# Patient Record
Sex: Female | Born: 1990 | Race: Black or African American | Hispanic: No | Marital: Single | Smoking: Current every day smoker
Health system: Southern US, Community
[De-identification: ages and names within clinical notes are randomized; demographics above are authoritative.]

## PROBLEM LIST (undated history)

## (undated) ENCOUNTER — Inpatient Hospital Stay (HOSPITAL_COMMUNITY): Payer: Self-pay

## (undated) DIAGNOSIS — A549 Gonococcal infection, unspecified: Secondary | ICD-10-CM

## (undated) DIAGNOSIS — A599 Trichomoniasis, unspecified: Secondary | ICD-10-CM

## (undated) DIAGNOSIS — B999 Unspecified infectious disease: Secondary | ICD-10-CM

## (undated) DIAGNOSIS — A749 Chlamydial infection, unspecified: Secondary | ICD-10-CM

## (undated) DIAGNOSIS — A64 Unspecified sexually transmitted disease: Secondary | ICD-10-CM

## (undated) HISTORY — PX: INDUCED ABORTION: SHX677

## (undated) HISTORY — PX: NO PAST SURGERIES: SHX2092

---

## 2013-12-15 ENCOUNTER — Emergency Department (HOSPITAL_BASED_OUTPATIENT_CLINIC_OR_DEPARTMENT_OTHER): Payer: Self-pay

## 2013-12-15 ENCOUNTER — Emergency Department (HOSPITAL_BASED_OUTPATIENT_CLINIC_OR_DEPARTMENT_OTHER)
Admission: EM | Admit: 2013-12-15 | Discharge: 2013-12-15 | Disposition: A | Discharge status: Home / Self Care | Payer: Self-pay | Attending: Emergency Medicine | Admitting: Emergency Medicine

## 2013-12-15 ENCOUNTER — Encounter (HOSPITAL_BASED_OUTPATIENT_CLINIC_OR_DEPARTMENT_OTHER): Payer: Self-pay | Admitting: Emergency Medicine

## 2013-12-15 DIAGNOSIS — Y9389 Activity, other specified: Secondary | ICD-10-CM | POA: Insufficient documentation

## 2013-12-15 DIAGNOSIS — S99921A Unspecified injury of right foot, initial encounter: Secondary | ICD-10-CM

## 2013-12-15 DIAGNOSIS — F17200 Nicotine dependence, unspecified, uncomplicated: Secondary | ICD-10-CM | POA: Insufficient documentation

## 2013-12-15 DIAGNOSIS — IMO0002 Reserved for concepts with insufficient information to code with codable children: Secondary | ICD-10-CM | POA: Insufficient documentation

## 2013-12-15 DIAGNOSIS — S99919A Unspecified injury of unspecified ankle, initial encounter: Principal | ICD-10-CM

## 2013-12-15 DIAGNOSIS — S99929A Unspecified injury of unspecified foot, initial encounter: Principal | ICD-10-CM

## 2013-12-15 DIAGNOSIS — S8990XA Unspecified injury of unspecified lower leg, initial encounter: Secondary | ICD-10-CM | POA: Insufficient documentation

## 2013-12-15 DIAGNOSIS — Y92410 Unspecified street and highway as the place of occurrence of the external cause: Secondary | ICD-10-CM | POA: Insufficient documentation

## 2013-12-15 MED ORDER — IBUPROFEN 600 MG PO TABS: 600.0000 mg | ORAL_TABLET | Freq: Four times a day (QID) | ORAL | Status: DC | PRN

## 2013-12-15 MED ORDER — IBUPROFEN 800 MG PO TABS
800.0000 mg | ORAL_TABLET | Freq: Once | ORAL | Status: AC
  Administered 2013-12-15: 800 mg via ORAL
  Filled 2013-12-15: qty 1

## 2013-12-15 NOTE — Discharge Instructions (Signed)
Take motrin 600 mg every 6 hrs for pain.   Use crutches. Keep foot elevated.   Use ace wrap as needed.   Follow up with ortho in a week if you still have pain to get repeat xray.   Return to ER if you have severe pain, unable to walk.

## 2013-12-15 NOTE — ED Notes (Signed)
MD at bedside. 
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
## 2015-04-24 ENCOUNTER — Emergency Department (HOSPITAL_BASED_OUTPATIENT_CLINIC_OR_DEPARTMENT_OTHER)
Admission: EM | Admit: 2015-04-24 | Discharge: 2015-04-25 | Disposition: A | Discharge status: Home / Self Care | Payer: Medicaid Other | Attending: Emergency Medicine | Admitting: Emergency Medicine

## 2015-04-24 ENCOUNTER — Encounter (HOSPITAL_BASED_OUTPATIENT_CLINIC_OR_DEPARTMENT_OTHER): Payer: Self-pay | Admitting: Emergency Medicine

## 2015-04-24 DIAGNOSIS — Z202 Contact with and (suspected) exposure to infections with a predominantly sexual mode of transmission: Secondary | ICD-10-CM | POA: Diagnosis not present

## 2015-04-24 DIAGNOSIS — R109 Unspecified abdominal pain: Secondary | ICD-10-CM

## 2015-04-24 DIAGNOSIS — A5901 Trichomonal vulvovaginitis: Secondary | ICD-10-CM | POA: Diagnosis not present

## 2015-04-24 DIAGNOSIS — O23511 Infections of cervix in pregnancy, first trimester: Secondary | ICD-10-CM | POA: Insufficient documentation

## 2015-04-24 DIAGNOSIS — O9989 Other specified diseases and conditions complicating pregnancy, childbirth and the puerperium: Secondary | ICD-10-CM | POA: Diagnosis present

## 2015-04-24 DIAGNOSIS — Z3A08 8 weeks gestation of pregnancy: Secondary | ICD-10-CM | POA: Diagnosis not present

## 2015-04-24 DIAGNOSIS — F17210 Nicotine dependence, cigarettes, uncomplicated: Secondary | ICD-10-CM | POA: Insufficient documentation

## 2015-04-24 DIAGNOSIS — O99331 Smoking (tobacco) complicating pregnancy, first trimester: Secondary | ICD-10-CM | POA: Diagnosis not present

## 2015-04-24 DIAGNOSIS — A64 Unspecified sexually transmitted disease: Secondary | ICD-10-CM

## 2015-04-24 DIAGNOSIS — N760 Acute vaginitis: Secondary | ICD-10-CM

## 2015-04-24 DIAGNOSIS — B9689 Other specified bacterial agents as the cause of diseases classified elsewhere: Secondary | ICD-10-CM

## 2015-04-24 DIAGNOSIS — O26899 Other specified pregnancy related conditions, unspecified trimester: Secondary | ICD-10-CM

## 2015-04-24 NOTE — ED Notes (Signed)
Pt in c/o abd pain x 1 week, approx [redacted] weeks pregnant, first pregnancy. Denies vaginal bleeding but endorsing discharge. Requesting STD check.

## 2015-04-24 NOTE — ED Notes (Signed)
Pt ambulated with no assistance to restroom.

## 2015-04-25 LAB — COMPREHENSIVE METABOLIC PANEL
ALBUMIN: 4.2 g/dL (ref 3.5–5.0)
ALK PHOS: 53 U/L (ref 38–126)
ALT: 19 U/L (ref 14–54)
ANION GAP: 8 (ref 5–15)
AST: 24 U/L (ref 15–41)
BILIRUBIN TOTAL: 0.5 mg/dL (ref 0.3–1.2)
BUN: 8 mg/dL (ref 6–20)
CO2: 22 mmol/L (ref 22–32)
CREATININE: 0.51 mg/dL (ref 0.44–1.00)
Calcium: 8.9 mg/dL (ref 8.9–10.3)
Chloride: 106 mmol/L (ref 101–111)
GFR calc Af Amer: >60 mL/min (ref >60)
GFR calc non Af Amer: >60 mL/min (ref >60)
GLUCOSE: 97 mg/dL (ref 65–99)
Potassium: 3.6 mmol/L (ref 3.5–5.1)
SODIUM: 136 mmol/L (ref 135–145)
Total Protein: 6.6 g/dL (ref 6.5–8.1)

## 2015-04-25 LAB — CBC WITH DIFFERENTIAL/PLATELET
BASOS ABS: 0 10*3/uL (ref 0.0–0.1)
Basophils Relative: 0 %
Eosinophils Absolute: 0.1 10*3/uL (ref 0.0–0.7)
Eosinophils Relative: 2 %
HEMATOCRIT: 35.9 % — AB (ref 36.0–46.0)
HEMOGLOBIN: 12.1 g/dL (ref 12.0–15.0)
Lymphocytes Relative: 26 %
Lymphs Abs: 2.3 10*3/uL (ref 0.7–4.0)
MCH: 27.8 pg (ref 26.0–34.0)
MCHC: 33.7 g/dL (ref 30.0–36.0)
MCV: 82.3 fL (ref 78.0–100.0)
MONOS PCT: 9 %
Monocytes Absolute: 0.8 10*3/uL (ref 0.1–1.0)
NEUTROS ABS: 5.5 10*3/uL (ref 1.7–7.7)
Neutrophils Relative %: 63 %
Platelets: 217 10*3/uL (ref 150–400)
RBC: 4.36 MIL/uL (ref 3.87–5.11)
RDW: 13.3 % (ref 11.5–15.5)
WBC: 8.7 10*3/uL (ref 4.0–10.5)

## 2015-04-25 LAB — URINALYSIS, ROUTINE W REFLEX MICROSCOPIC
Bilirubin Urine: NEGATIVE
Glucose, UA: NEGATIVE mg/dL
HGB URINE DIPSTICK: NEGATIVE
Ketones, ur: NEGATIVE mg/dL
Nitrite: NEGATIVE
PH: 7 (ref 5.0–8.0)
PROTEIN: NEGATIVE mg/dL
Specific Gravity, Urine: 1.017 (ref 1.005–1.030)

## 2015-04-25 LAB — WET PREP, GENITAL
Sperm: NONE SEEN
Yeast Wet Prep HPF POC: NONE SEEN

## 2015-04-25 LAB — PREGNANCY, URINE: Preg Test, Ur: POSITIVE — AB

## 2015-04-25 LAB — URINE MICROSCOPIC-ADD ON

## 2015-04-25 LAB — LIPASE, BLOOD: Lipase: 19 U/L (ref 11–51)

## 2015-04-25 MED ORDER — AZITHROMYCIN 250 MG PO TABS: 250.0000 mg | ORAL_TABLET | Freq: Every day | ORAL | Status: DC

## 2015-04-25 MED ORDER — CEFTRIAXONE SODIUM 250 MG IJ SOLR
250.0000 mg | Freq: Once | INTRAMUSCULAR | Status: AC
  Administered 2015-04-25: 250 mg via INTRAMUSCULAR
  Filled 2015-04-25: qty 250

## 2015-04-25 MED ORDER — METRONIDAZOLE 500 MG PO TABS: 500.0000 mg | ORAL_TABLET | Freq: Two times a day (BID) | ORAL | Status: DC

## 2015-04-25 MED ORDER — ONDANSETRON 4 MG PO TBDP
4.0000 mg | ORAL_TABLET | Freq: Once | ORAL | Status: AC
  Administered 2015-04-25: 4 mg via ORAL
  Filled 2015-04-25: qty 1

## 2015-04-25 MED ORDER — AZITHROMYCIN 250 MG PO TABS
500.0000 mg | ORAL_TABLET | Freq: Once | ORAL | Status: AC
  Administered 2015-04-25: 500 mg via ORAL
  Filled 2015-04-25: qty 2

## 2015-04-25 MED ORDER — METRONIDAZOLE 500 MG PO TABS
2000.0000 mg | ORAL_TABLET | Freq: Once | ORAL | Status: AC
  Administered 2015-04-25: 2000 mg via ORAL
  Filled 2015-04-25: qty 4

## 2015-04-25 NOTE — Discharge Instructions (Signed)
Bacterial Vaginosis Sharon Pearson, you were treated for infections.  Continue to take antibiotics for 1 week.  See OB/GYN within 3 days for close follow up.  If symptoms worsen, come back to the ED immediately.  Thank you.  Bacterial vaginosis is an infection of the vagina. It happens when too many germs (bacteria) grow in the vagina. Having this infection puts you at risk for getting other infections from sex. Treating this infection can help lower your risk for other infections, such as:   Chlamydia.  Gonorrhea.  HIV.  Herpes. HOME CARE  Take your medicine as told by your doctor.  Finish your medicine even if you start to feel better.  Tell your sex partner that you have an infection. They should see their doctor for treatment.  During treatment:  Avoid sex or use condoms correctly.  Do not douche.  Do not drink alcohol unless your doctor tells you it is ok.  Do not breastfeed unless your doctor tells you it is ok. GET HELP IF:  You are not getting better after 3 days of treatment.  You have more grey fluid (discharge) coming from your vagina than before.  You have more pain than before.  You have a fever. MAKE SURE YOU:   Understand these instructions.  Will watch your condition.  Will get help right away if you are not doing well or get worse.   This information is not intended to replace advice given to you by your health care provider. Make sure you discuss any questions you have with your health care provider.   Document Released: 01/10/2008 Document Revised: 04/23/2014 Document Reviewed: 11/12/2012 Elsevier Interactive Patient Education 2016 [employer]. Trichomoniasis Trichomoniasis is an infection caused by an organism called Trichomonas. The infection can affect both women and men. In women, the outer female genitalia and the vagina are affected. In men, the penis is mainly affected, but the prostate and other reproductive organs can also be involved.  Trichomoniasis is a sexually transmitted infection (STI) and is most often passed to another person through sexual contact.  RISK FACTORS  Having unprotected sexual intercourse.  Having sexual intercourse with an infected partner. SIGNS AND SYMPTOMS  Symptoms of trichomoniasis in women include:  Abnormal gray-green frothy vaginal discharge.  Itching and irritation of the vagina.  Itching and irritation of the area outside the vagina. Symptoms of trichomoniasis in men include:   Penile discharge with or without pain.  Pain during urination. This results from inflammation of the urethra. DIAGNOSIS  Trichomoniasis may be found during a Pap test or physical exam. Your health care provider may use one of the following methods to help diagnose this infection:  Testing the pH of the vagina with a test tape.  Using a vaginal swab test that checks for the Trichomonas organism. A test is available that provides results within a few minutes.  Examining a urine sample.  Testing vaginal secretions. Your health care provider may test you for other STIs, including HIV. TREATMENT   You may be given medicine to fight the infection. Women should inform their health care provider if they could be or are pregnant. Some medicines used to treat the infection should not be taken during pregnancy.  Your health care provider may recommend over-the-counter medicines or creams to decrease itching or irritation.  Your sexual partner will need to be treated if infected.  Your health care provider may test you for infection again 3 months after treatment. HOME CARE INSTRUCTIONS  Take medicines only as directed by your health care provider.  Take over-the-counter medicine for itching or irritation as directed by your health care provider.  Do not have sexual intercourse while you have the infection.  Women should not douche or wear tampons while they have the infection.  Discuss your infection  with your partner. Your partner may have gotten the infection from you, or you may have gotten it from your partner.  Have your sex partner get examined and treated if necessary.  Practice safe, informed, and protected sex.  See your health care provider for other STI testing. SEEK MEDICAL CARE IF:   You still have symptoms after you finish your medicine.  You develop abdominal pain.  You have pain when you urinate.  You have bleeding after sexual intercourse.  You develop a rash.  Your medicine makes you sick or makes you throw up (vomit). MAKE SURE YOU:  Understand these instructions.  Will watch your condition.  Will get help right away if you are not doing well or get worse.   This information is not intended to replace advice given to you by your health care provider. Make sure you discuss any questions you have with your health care provider.   Document Released: 09/26/2000 Document Revised: 04/23/2014 Document Reviewed: 01/12/2013 Elsevier Interactive Patient Education [employer]. Sexually Transmitted Disease A sexually transmitted disease (STD) is a disease or infection often passed to another person during sex. However, STDs can be passed through nonsexual ways. An STD can be passed through:  Spit (saliva).  Semen.  Blood.  Mucus from the vagina.  Pee (urine). HOW CAN I LESSEN MY CHANCES OF GETTING AN STD?  Use:  Latex condoms.  Water-soluble lubricants with condoms. Do not use petroleum jelly or oils.  Dental dams. These are small pieces of latex that are used as a barrier during oral sex.  Avoid having more than one sex partner.  Do not have sex with someone who has other sex partners.  Do not have sex with anyone you do not know or who is at high risk for an STD.  Avoid risky sex that can break your skin.  Do not have sex if you have open sores on your mouth or skin.  Avoid drinking too much alcohol or taking illegal drugs. Alcohol  and drugs can affect your good judgment.  Avoid oral and anal sex acts.  Get shots (vaccines) for HPV and hepatitis.  If you are at risk of being infected with HIV, it is advised that you take a certain medicine daily to prevent HIV infection. This is called pre-exposure prophylaxis (PrEP). You may be at risk if:  You are a man who has sex with other men (MSM).  You are attracted to the opposite sex (heterosexual) and are having sex with more than one partner.  You take drugs with a needle.  You have sex with someone who has HIV.  Talk with your doctor about if you are at high risk of being infected with HIV. If you begin to take PrEP, get tested for HIV first. Get tested every 3 months for as long as you are taking PrEP.  Get tested for STDs every year if you are sexually active. If you are treated for an STD, get tested again 3 months after you are treated. WHAT SHOULD I DO IF I THINK I HAVE AN STD?  See your doctor.  Tell your sex partner(s) that you have an STD. They  should be tested and treated.  Do not have sex until your doctor says it is okay. WHEN SHOULD I GET HELP? Get help right away if:  You have bad belly (abdominal) pain.  You are a man and have puffiness (swelling) or pain in your testicles.  You are a woman and have puffiness in your vagina.   This information is not intended to replace advice given to you by your health care provider. Make sure you discuss any questions you have with your health care provider.   Document Released: 05/10/2004 Document Revised: 04/23/2014 Document Reviewed: 09/26/2012 Elsevier Interactive Patient Education [employer].
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
## 2015-04-25 NOTE — ED Notes (Signed)
Actively vomiting

## 2015-04-25 NOTE — ED Notes (Signed)
Pt seen by EDP prior to RN assessment, see MD notes, orders received to medicate and d/c, initiated.

## 2015-04-25 NOTE — ED Notes (Signed)
Pt vomited PO medications, EDP aware.

## 2015-04-25 NOTE — ED Notes (Signed)
meds re-given prior to pt leaving, denies needs or questions

## 2015-04-25 NOTE — ED Notes (Signed)
Denies needs or questions, meds explained, f/u explained, no obvious sx of reaction, denies new sx, alert, NAD, calm, steady gait, given Rx x2 and referral.

## 2015-04-26 LAB — GC/CHLAMYDIA PROBE AMP (~~LOC~~) NOT AT ARMC
Chlamydia: NEGATIVE
Neisseria Gonorrhea: NEGATIVE

## 2015-05-28 ENCOUNTER — Encounter (HOSPITAL_BASED_OUTPATIENT_CLINIC_OR_DEPARTMENT_OTHER): Payer: Self-pay | Admitting: Emergency Medicine

## 2015-05-28 ENCOUNTER — Emergency Department (HOSPITAL_BASED_OUTPATIENT_CLINIC_OR_DEPARTMENT_OTHER)
Admission: EM | Admit: 2015-05-28 | Discharge: 2015-05-28 | Disposition: A | Discharge status: Home / Self Care | Payer: Medicaid Other | Attending: Emergency Medicine | Admitting: Emergency Medicine

## 2015-05-28 DIAGNOSIS — N730 Acute parametritis and pelvic cellulitis: Secondary | ICD-10-CM | POA: Insufficient documentation

## 2015-05-28 DIAGNOSIS — N760 Acute vaginitis: Secondary | ICD-10-CM | POA: Diagnosis not present

## 2015-05-28 DIAGNOSIS — F17210 Nicotine dependence, cigarettes, uncomplicated: Secondary | ICD-10-CM | POA: Insufficient documentation

## 2015-05-28 DIAGNOSIS — Z3202 Encounter for pregnancy test, result negative: Secondary | ICD-10-CM | POA: Insufficient documentation

## 2015-05-28 DIAGNOSIS — B9689 Other specified bacterial agents as the cause of diseases classified elsewhere: Secondary | ICD-10-CM

## 2015-05-28 DIAGNOSIS — Z792 Long term (current) use of antibiotics: Secondary | ICD-10-CM | POA: Insufficient documentation

## 2015-05-28 DIAGNOSIS — R300 Dysuria: Secondary | ICD-10-CM | POA: Diagnosis present

## 2015-05-28 LAB — WET PREP, GENITAL
Sperm: NONE SEEN
Trich, Wet Prep: NONE SEEN

## 2015-05-28 LAB — URINALYSIS, ROUTINE W REFLEX MICROSCOPIC
BILIRUBIN URINE: NEGATIVE
Glucose, UA: NEGATIVE mg/dL
HGB URINE DIPSTICK: NEGATIVE
KETONES UR: NEGATIVE mg/dL
Leukocytes, UA: NEGATIVE
NITRITE: NEGATIVE
PROTEIN: NEGATIVE mg/dL
Specific Gravity, Urine: 1.021 (ref 1.005–1.030)
pH: 6.5 (ref 5.0–8.0)

## 2015-05-28 LAB — PREGNANCY, URINE: PREG TEST UR: NEGATIVE

## 2015-05-28 MED ORDER — METRONIDAZOLE 500 MG PO TABS: 500.0000 mg | ORAL_TABLET | Freq: Two times a day (BID) | ORAL | Status: DC

## 2015-05-28 MED ORDER — LIDOCAINE HCL (PF) 1 % IJ SOLN
INTRAMUSCULAR | Status: AC
  Administered 2015-05-28: 5 mL
  Filled 2015-05-28: qty 5

## 2015-05-28 MED ORDER — CEFTRIAXONE SODIUM 250 MG IJ SOLR
250.0000 mg | Freq: Once | INTRAMUSCULAR | Status: AC
  Administered 2015-05-28: 250 mg via INTRAMUSCULAR
  Filled 2015-05-28: qty 250

## 2015-05-28 MED ORDER — DOXYCYCLINE HYCLATE 100 MG PO CAPS: 100.0000 mg | ORAL_CAPSULE | Freq: Two times a day (BID) | ORAL | Status: DC

## 2015-05-28 MED ORDER — DOXYCYCLINE HYCLATE 100 MG PO TABS
100.0000 mg | ORAL_TABLET | Freq: Once | ORAL | Status: AC
  Administered 2015-05-28: 100 mg via ORAL
  Filled 2015-05-28: qty 1

## 2015-05-28 NOTE — Discharge Instructions (Signed)
Bacterial Vaginosis Bacterial vaginosis is a vaginal infection that occurs when the normal balance of bacteria in the vagina is disrupted. It results from an overgrowth of certain bacteria. This is the most common vaginal infection in women of childbearing age. Treatment is important to prevent complications, especially in pregnant women, as it can cause a premature delivery. CAUSES  Bacterial vaginosis is caused by an increase in harmful bacteria that are normally present in smaller amounts in the vagina. Several different kinds of bacteria can cause bacterial vaginosis. However, the reason that the condition develops is not fully understood. RISK FACTORS Certain activities or behaviors can put you at an increased risk of developing bacterial vaginosis, including:  Having a new sex partner or multiple sex partners.  Douching.  Using an intrauterine device (IUD) for contraception. Women do not get bacterial vaginosis from toilet seats, bedding, swimming pools, or contact with objects around them. SIGNS AND SYMPTOMS  Some women with bacterial vaginosis have no signs or symptoms. Common symptoms include:  Grey vaginal discharge.  A fishlike odor with discharge, especially after sexual intercourse.  Itching or burning of the vagina and vulva.  Burning or pain with urination. DIAGNOSIS  Your health care provider will take a medical history and examine the vagina for signs of bacterial vaginosis. A sample of vaginal fluid may be taken. Your health care provider will look at this sample under a microscope to check for bacteria and abnormal cells. A vaginal pH test may also be done.  TREATMENT  Bacterial vaginosis may be treated with antibiotic medicines. These may be given in the form of a pill or a vaginal cream. A second round of antibiotics may be prescribed if the condition comes back after treatment. Because bacterial vaginosis increases your risk for sexually transmitted diseases, getting  treated can help reduce your risk for chlamydia, gonorrhea, HIV, and herpes. HOME CARE INSTRUCTIONS   Only take over-the-counter or prescription medicines as directed by your health care provider.  If antibiotic medicine was prescribed, take it as directed. Make sure you finish it even if you start to feel better.  Tell all sexual partners that you have a vaginal infection. They should see their health care provider and be treated if they have problems, such as a mild rash or itching.  During treatment, it is important that you follow these instructions:  Avoid sexual activity or use condoms correctly.  Do not douche.  Avoid alcohol as directed by your health care provider.  Avoid breastfeeding as directed by your health care provider. SEEK MEDICAL CARE IF:   Your symptoms are not improving after 3 days of treatment.  You have increased discharge or pain.  You have a fever. MAKE SURE YOU:   Understand these instructions.  Will watch your condition.  Will get help right away if you are not doing well or get worse. FOR MORE INFORMATION  Centers for Disease Control and Prevention, Division of STD Prevention: [employer] American Sexual Health Association (ASHA): www.ashastd.org    This information is not intended to replace advice given to you by your health care provider. Make sure you discuss any questions you have with your health care provider.   Document Released: 04/02/2005 Document Revised: 04/23/2014 Document Reviewed: 11/12/2012 Elsevier Interactive Patient Education 2016 [employer].  Sexually Transmitted Disease A sexually transmitted disease (STD) is a disease or infection that may be passed (transmitted) from person to person, usually during sexual activity. This may happen by way of saliva, semen, blood, vaginal  mucus, or urine. Common STDs include:  Gonorrhea.  Chlamydia.  Syphilis.  HIV and AIDS.  Genital herpes.  Hepatitis B and  C.  Trichomonas.  Human papillomavirus (HPV).  Pubic lice.  Scabies.  Mites.  Bacterial vaginosis. WHAT ARE CAUSES OF STDs? An STD may be caused by bacteria, a virus, or parasites. STDs are often transmitted during sexual activity if one person is infected. However, they may also be transmitted through nonsexual means. STDs may be transmitted after:   Sexual intercourse with an infected person.  Sharing sex toys with an infected person.  Sharing needles with an infected person or using unclean piercing or tattoo needles.  Having intimate contact with the genitals, mouth, or rectal areas of an infected person.  Exposure to infected fluids during birth. WHAT ARE THE SIGNS AND SYMPTOMS OF STDs? Different STDs have different symptoms. Some people may not have any symptoms. If symptoms are present, they may include:  Painful or bloody urination.  Pain in the pelvis, abdomen, vagina, anus, throat, or eyes.  A skin rash, itching, or irritation.  Growths, ulcerations, blisters, or sores in the genital and anal areas.  Abnormal vaginal discharge with or without bad odor.  Penile discharge in men.  Fever.  Pain or bleeding during sexual intercourse.  Swollen glands in the groin area.  Yellow skin and eyes (jaundice). This is seen with hepatitis.  Swollen testicles.  Infertility.  Sores and blisters in the mouth. HOW ARE STDs DIAGNOSED? To make a diagnosis, your health care provider may:  Take a medical history.  Perform a physical exam.  Take a sample of any discharge to examine.  Swab the throat, cervix, opening to the penis, rectum, or vagina for testing.  Test a sample of your first morning urine.  Perform blood tests.  Perform a Pap test, if this applies.  Perform a colposcopy.  Perform a laparoscopy. HOW ARE STDs TREATED? Treatment depends on the STD. Some STDs may be treated but not cured.  Chlamydia, gonorrhea, trichomonas, and syphilis can be  cured with antibiotic medicine.  Genital herpes, hepatitis, and HIV can be treated, but not cured, with prescribed medicines. The medicines lessen symptoms.  Genital warts from HPV can be treated with medicine or by freezing, burning (electrocautery), or surgery. Warts may come back.  HPV cannot be cured with medicine or surgery. However, abnormal areas may be removed from the cervix, vagina, or vulva.  If your diagnosis is confirmed, your recent sexual partners need treatment. This is true even if they are symptom-free or have a negative culture or evaluation. They should not have sex until their health care providers say it is okay.  Your health care provider may test you for infection again 3 months after treatment. HOW CAN I REDUCE MY RISK OF GETTING AN STD? Take these steps to reduce your risk of getting an STD:  Use latex condoms, dental dams, and water-soluble lubricants during sexual activity. Do not use petroleum jelly or oils.  Avoid having multiple sex partners.  Do not have sex with someone who has other sex partners  Do not have sex with anyone you do not know or who is at high risk for an STD.  Avoid risky sex practices that can break your skin.  Do not have sex if you have open sores on your mouth or skin.  Avoid drinking too much alcohol or taking illegal drugs. Alcohol and drugs can affect your judgment and put you in a vulnerable position.  Avoid engaging in oral and anal sex acts.  Get vaccinated for HPV and hepatitis. If you have not received these vaccines in the past, talk to your health care provider about whether one or both might be right for you.  If you are at risk of being infected with HIV, it is recommended that you take a prescription medicine daily to prevent HIV infection. This is called pre-exposure prophylaxis (PrEP). You are considered at risk if:  You are a man who has sex with other men (MSM).  You are a heterosexual man or woman and are  sexually active with more than one partner.  You take drugs by injection.  You are sexually active with a partner who has HIV.  Talk with your health care provider about whether you are at high risk of being infected with HIV. If you choose to begin PrEP, you should first be tested for HIV. You should then be tested every 3 months for as long as you are taking PrEP. WHAT SHOULD I DO IF I THINK I HAVE AN STD?  See your health care provider.  Tell your sexual partner(s). They should be tested and treated for any STDs.  Do not have sex until your health care provider says it is okay. WHEN SHOULD I GET IMMEDIATE MEDICAL CARE? Contact your health care provider right away if:   You have severe abdominal pain.  You are a man and notice swelling or pain in your testicles.  You are a woman and notice swelling or pain in your vagina.   This information is not intended to replace advice given to you by your health care provider. Make sure you discuss any questions you have with your health care provider.   Document Released: 06/23/2002 Document Revised: 04/23/2014 Document Reviewed: 10/21/2012 Elsevier Interactive Patient Education [employer].

## 2015-05-28 NOTE — ED Notes (Signed)
Pt states that her significant other is constantly being exposed and being treated for STDs

## 2015-05-28 NOTE — ED Notes (Signed)
Denies any fevers, nausea or vomiting. No bleeding noted as well per pt statement

## 2015-05-28 NOTE — ED Notes (Signed)
Pt states burning/heat with urination and burning on her clitoris when boyfriend performs oral sex.  Pt had abortion procedure done 5 weeks ago and was treated for Gonorrhea & Chlamydia prior to that and is concerned about healing post-procedure and return of infection.  Pt unsure if boyfriend completed treatment for G&C as well.

## 2015-05-28 NOTE — ED Notes (Signed)
Having burning sensation at vaginal area, onset approx 1 week ago, states had abortion recently. States urine has been difficult as well.

## 2015-05-28 NOTE — ED Provider Notes (Signed)
CSN: [id number]     Arrival date & time 05/28/15  1131 History   First MD Initiated Contact with Patient 05/28/15 1146     Chief Complaint  Patient presents with  . Dysuria  . Vaginal Pain     (Consider location/radiation/quality/duration/timing/severity/associated sxs/prior Treatment) Patient is a 25 y.o. female presenting with dysuria, vaginal pain, and female genitourinary complaint. The history is provided by the patient.  Dysuria Associated symptoms: no abdominal pain, no fever, no nausea and no vomiting   Vaginal Pain Pertinent negatives include no chest pain, no abdominal pain, no headaches and no shortness of breath.  Female GU Problem This is a new problem. The current episode started 2 days ago. The problem occurs constantly. The problem has not changed since onset.Pertinent negatives include no chest pain, no abdominal pain, no headaches and no shortness of breath. Nothing aggravates the symptoms. Nothing relieves the symptoms. She has tried nothing for the symptoms. The treatment provided no relief.   25 yo F with a chief complaints of dysuria. This started about 2 days ago. Patient states is also worse when she received oral sex. Denies any discharge denies pelvic cramping. States that her boyfriend commonly is diagnosed and treated for sexual transmitted diseases. She has had sex without contraception. Denies vaginal bleeding.   History reviewed. No pertinent past medical history. History reviewed. No pertinent past surgical history. No family history on file. Social History  Substance Use Topics  . Smoking status: Current Every Day Smoker -- 0.50 packs/day    Types: Cigarettes  . Smokeless tobacco: None  . Alcohol Use: No   OB History    Gravida Para Term Preterm AB TAB SAB Ectopic Multiple Living   1    1          Review of Systems  Constitutional: Negative for fever and chills.  HENT: Negative for congestion and rhinorrhea.   Eyes: Negative for redness and  visual disturbance.  Respiratory: Negative for shortness of breath and wheezing.   Cardiovascular: Negative for chest pain and palpitations.  Gastrointestinal: Negative for nausea, vomiting and abdominal pain.  Genitourinary: Positive for vaginal pain. Negative for dysuria and urgency.  Musculoskeletal: Negative for myalgias and arthralgias.  Skin: Negative for pallor and wound.  Neurological: Negative for dizziness and headaches.      Allergies  Review of patient's allergies indicates no known allergies.  Home Medications   Prior to Admission medications   Medication Sig Start Date End Date Taking? Authorizing Provider  azithromycin (ZITHROMAX) 250 MG tablet Take 1 tablet (250 mg total) by mouth daily. 04/25/15   Everlene Balls, MD  doxycycline (VIBRAMYCIN) 100 MG capsule Take 1 capsule (100 mg total) by mouth 2 (two) times daily. One po bid x 7 days 05/28/15   Deno Etienne, DO  ibuprofen (ADVIL,MOTRIN) 600 MG tablet Take 1 tablet (600 mg total) by mouth every 6 (six) hours as needed. 12/15/13   Wandra Arthurs, MD  metroNIDAZOLE (FLAGYL) 500 MG tablet Take 1 tablet (500 mg total) by mouth 2 (two) times daily. One po bid x 7 days 05/28/15   Deno Etienne, DO   BP 121/94 mmHg  Pulse 115  Temp(Src) 98 F (36.7 C) (Oral)  Resp 18  Ht 5\' 2"  (1.575 m)  Wt 138 lb (62.596 kg)  BMI 25.23 kg/m2  SpO2 100%  LMP  (LMP Unknown) Physical Exam  Constitutional: She is oriented to person, place, and time. She appears well-developed and well-nourished. No distress.  HENT:  Head: Normocephalic and atraumatic.  Eyes: EOM are normal. Pupils are equal, round, and reactive to light.  Neck: Normal range of motion. Neck supple.  Cardiovascular: Normal rate and regular rhythm.  Exam reveals no gallop and no friction rub.   No murmur heard. Pulmonary/Chest: Effort normal. She has no wheezes. She has no rales.  Abdominal: Soft. She exhibits no distension. There is no tenderness.  Genitourinary: Cervix exhibits motion  tenderness and discharge (copious). Right adnexum displays no mass, no tenderness and no fullness. Left adnexum displays no mass, no tenderness and no fullness.  Musculoskeletal: She exhibits no edema or tenderness.  Neurological: She is alert and oriented to person, place, and time.  Skin: Skin is warm and dry. She is not diaphoretic.  Psychiatric: She has a normal mood and affect. Her behavior is normal.  Nursing note and vitals reviewed.   ED Course  Procedures (including critical care time) Labs Review Labs Reviewed  WET PREP, GENITAL - Abnormal; Notable for the following:    Yeast Wet Prep HPF POC PRESENT (*)    Clue Cells Wet Prep HPF POC PRESENT (*)    WBC, Wet Prep HPF POC FEW (*)    All other components within normal limits  URINALYSIS, ROUTINE W REFLEX MICROSCOPIC (NOT AT [facility]) - Abnormal; Notable for the following:    APPearance CLOUDY (*)    All other components within normal limits  PREGNANCY, URINE  HIV ANTIBODY (ROUTINE TESTING)  GC/CHLAMYDIA PROBE AMP ([place]) NOT AT [facility]    Imaging Review No results found. I have personally reviewed and evaluated these images and lab results as part of my medical decision-making.   EKG Interpretation None      MDM   Final diagnoses:  PID (acute pelvic inflammatory disease)  BV (bacterial vaginosis)    25 yo F with a chief complaint of dysuria. Patient with cervical motion tenderness on exam. Will treat her for PID. PCP follow-up.  1:04 PM:  I have discussed the diagnosis/risks/treatment options with the patient and believe the pt to be eligible for discharge home to follow-up with PCP. We also discussed returning to the ED immediately if new or worsening sx occur. We discussed the sx which are most concerning (e.g., sudden worsening pain, fever, inability to tolerate by mouth) that necessitate immediate return. Medications administered to the patient during their visit and any new prescriptions provided to the patient  are listed below.  Medications given during this visit Medications  cefTRIAXone (ROCEPHIN) injection 250 mg (250 mg Intramuscular Given 05/28/15 1224)  doxycycline (VIBRA-TABS) tablet 100 mg (100 mg Oral Given 05/28/15 1224)  lidocaine (PF) (XYLOCAINE) 1 % injection (5 mLs  Given 05/28/15 1224)    New Prescriptions   DOXYCYCLINE (VIBRAMYCIN) 100 MG CAPSULE    Take 1 capsule (100 mg total) by mouth 2 (two) times daily. One po bid x 7 days   METRONIDAZOLE (FLAGYL) 500 MG TABLET    Take 1 tablet (500 mg total) by mouth 2 (two) times daily. One po bid x 7 days    The patient appears reasonably screen and/or stabilized for discharge and I doubt any other medical condition or other [facility] requiring further screening, evaluation, or treatment in the ED at this time prior to discharge.      Deno Etienne, DO 05/28/15 1304

## 2015-05-28 NOTE — ED Notes (Signed)
No adverse reactions noted from Rocephin IM, pt voices no complaints. DC instructions reviewed with pt, also discussed the abx as prescribed by EDP and importance of taking and completing all abx as prescribed for best benefit. Also discussed safe sex practices. Opportunity for questions provided. Teach Back method used

## 2015-05-29 LAB — HIV ANTIBODY (ROUTINE TESTING W REFLEX): HIV Screen 4th Generation wRfx: NONREACTIVE

## 2015-05-30 LAB — GC/CHLAMYDIA PROBE AMP (~~LOC~~) NOT AT ARMC
CHLAMYDIA, DNA PROBE: NEGATIVE
Neisseria Gonorrhea: NEGATIVE

## 2015-06-22 ENCOUNTER — Emergency Department (HOSPITAL_BASED_OUTPATIENT_CLINIC_OR_DEPARTMENT_OTHER): Payer: Medicaid Other

## 2015-06-22 ENCOUNTER — Emergency Department (HOSPITAL_BASED_OUTPATIENT_CLINIC_OR_DEPARTMENT_OTHER)
Admission: EM | Admit: 2015-06-22 | Discharge: 2015-06-23 | Disposition: A | Discharge status: Home / Self Care | Payer: Medicaid Other | Attending: Emergency Medicine | Admitting: Emergency Medicine

## 2015-06-22 ENCOUNTER — Encounter (HOSPITAL_BASED_OUTPATIENT_CLINIC_OR_DEPARTMENT_OTHER): Payer: Self-pay | Admitting: Emergency Medicine

## 2015-06-22 DIAGNOSIS — F17210 Nicotine dependence, cigarettes, uncomplicated: Secondary | ICD-10-CM | POA: Diagnosis not present

## 2015-06-22 DIAGNOSIS — W228XXA Striking against or struck by other objects, initial encounter: Secondary | ICD-10-CM | POA: Insufficient documentation

## 2015-06-22 DIAGNOSIS — Y9389 Activity, other specified: Secondary | ICD-10-CM | POA: Diagnosis not present

## 2015-06-22 DIAGNOSIS — S4992XA Unspecified injury of left shoulder and upper arm, initial encounter: Secondary | ICD-10-CM | POA: Diagnosis present

## 2015-06-22 DIAGNOSIS — Z8619 Personal history of other infectious and parasitic diseases: Secondary | ICD-10-CM | POA: Insufficient documentation

## 2015-06-22 DIAGNOSIS — Y998 Other external cause status: Secondary | ICD-10-CM | POA: Insufficient documentation

## 2015-06-22 DIAGNOSIS — Y9289 Other specified places as the place of occurrence of the external cause: Secondary | ICD-10-CM | POA: Diagnosis not present

## 2015-06-22 DIAGNOSIS — Z792 Long term (current) use of antibiotics: Secondary | ICD-10-CM | POA: Insufficient documentation

## 2015-06-22 HISTORY — DX: Unspecified sexually transmitted disease: A64

## 2015-06-22 MED ORDER — IBUPROFEN 600 MG PO TABS: 600.0000 mg | ORAL_TABLET | Freq: Four times a day (QID) | ORAL | Status: DC | PRN

## 2015-06-22 MED ORDER — IBUPROFEN 800 MG PO TABS
800.0000 mg | ORAL_TABLET | Freq: Once | ORAL | Status: AC
  Administered 2015-06-23: 800 mg via ORAL
  Filled 2015-06-22: qty 1

## 2015-06-22 NOTE — ED Notes (Signed)
Roughhousing at home with family and injured left shoulder 2 days ago.  Unable to lift shoulder.  CMS intact distally.

## 2015-06-22 NOTE — ED Notes (Signed)
Pt on cell phone rates pain in left shoulder 10/10. States she can move left to right but pain with raising. Dexterity intact.

## 2015-06-22 NOTE — ED Provider Notes (Signed)
CSN: [id number]     Arrival date & time 06/22/15  2210 History   First MD Initiated Contact with Patient 06/22/15 2337     Chief Complaint  Patient presents with  . Shoulder Pain     (Consider location/radiation/quality/duration/timing/severity/associated sxs/prior Treatment) HPI  25 year old female presenting for evaluation of left shoulder injury. Patient reports she was "roughhousing at home with family" and injured her left shoulder approximately 2 days ago. She sts she was knocked down on the ground striking her L shoulder against the wall.  She did had a brief LOC.  She described pain as sharp and throbbing sensation nonradiating to shoulder worsen with movement and unable to lift her shoulder. She denies any significant headache or neck pain other injury from the fall. She denies any numbness or tingling sensation down the arm. She noticed more pain at nighttime. She denies any prior injury to the same shoulder. No complaint of elbow or wrist pain. Patient states she is currently not pregnant.  Past Medical History  Diagnosis Date  . STD (female)    History reviewed. No pertinent past surgical history. No family history on file. Social History  Substance Use Topics  . Smoking status: Current Every Day Smoker -- 1.00 packs/day    Types: Cigarettes  . Smokeless tobacco: None  . Alcohol Use: No   OB History    Gravida Para Term Preterm AB TAB SAB Ectopic Multiple Living   1    1          Review of Systems  Constitutional: Negative for fever.  Musculoskeletal: Positive for arthralgias.  Skin: Negative for rash and wound.  Neurological: Negative for numbness.      Allergies  Review of patient's allergies indicates no known allergies.  Home Medications   Prior to Admission medications   Medication Sig Start Date End Date Taking? Authorizing Provider  azithromycin (ZITHROMAX) 250 MG tablet Take 1 tablet (250 mg total) by mouth daily. 04/25/15   Everlene Balls, MD   doxycycline (VIBRAMYCIN) 100 MG capsule Take 1 capsule (100 mg total) by mouth 2 (two) times daily. One po bid x 7 days 05/28/15   Deno Etienne, DO  ibuprofen (ADVIL,MOTRIN) 600 MG tablet Take 1 tablet (600 mg total) by mouth every 6 (six) hours as needed. 12/15/13   Wandra Arthurs, MD  metroNIDAZOLE (FLAGYL) 500 MG tablet Take 1 tablet (500 mg total) by mouth 2 (two) times daily. One po bid x 7 days 05/28/15   Deno Etienne, DO   BP 106/76 mmHg  Pulse 93  Temp(Src) 98.2 F (36.8 C) (Oral)  Resp 16  Ht 5\' 3"  (1.6 m)  Wt 63.05 kg  BMI 24.63 kg/m2  SpO2 100%  LMP 06/01/2015 (Approximate) Physical Exam  Constitutional: She appears well-developed and well-nourished. No distress.  African-American female sitting upright in no acute discomfort.  HENT:  Head: Atraumatic.  Eyes: Conjunctivae are normal.  Neck: Neck supple.  Cardiovascular: Normal rate and regular rhythm.   Pulmonary/Chest: Effort normal. She exhibits no tenderness.  Abdominal: Soft. There is no tenderness.  Musculoskeletal: She exhibits tenderness (Left shoulder: Tenderness to lateral deltoid and posterior shoulder on palpation without gross deformity or overlying skin changes. Decrease shoulder abduction and abduction and rotation secondary to pain. Left elbow or left wrist are nontender. ).  No cervical midline spine tenderness. Sensation is intact throughout left arm.  Neurological: She is alert.  Skin: No rash noted.  Psychiatric: She has a normal mood and affect.  Nursing note and vitals reviewed.   ED Course  Procedures (including critical care time) Labs Review Labs Reviewed - No data to display  Imaging Review Dg Shoulder Left  06/22/2015  CLINICAL DATA:  Left shoulder pain after injury 2 days prior. EXAM: LEFT SHOULDER - 2+ VIEW COMPARISON:  None. FINDINGS: There is no evidence of fracture or dislocation. There is no evidence of arthropathy or other focal bone abnormality. Soft tissues are unremarkable. IMPRESSION:  Negative radiographs of the left shoulder. Electronically Signed   By: Jeb Levering M.D.   On: 06/22/2015 22:55   I have personally reviewed and evaluated these images and lab results as part of my medical decision-making.   EKG Interpretation None      MDM   Final diagnoses:  Shoulder injury, left, initial encounter    BP 106/76 mmHg  Pulse 93  Temp(Src) 98.2 F (36.8 C) (Oral)  Resp 16  Ht 5\' 3"  (1.6 m)  Wt 63.05 kg  BMI 24.63 kg/m2  SpO2 100%  LMP 06/01/2015 (Approximate)  11:53 PM Patient with left shoulder injury. She is having difficulty moving her shoulder. Her range of motion is limited secondary to pain both active and passive. X-ray of shoulder shows no acute fractures or dislocation. Suspect shoulder sprain versus rotator cuff injury. Sling provided for support. Rice therapy discussed. Orthopedic referral given as needed. Return precaution discussed.    Domenic Moras, PA-C 06/22/15 2354  April Palumbo, MD 06/23/15 [phone]

## 2015-06-22 NOTE — Discharge Instructions (Signed)
Your shoulder injury may be a shoulder sprain but could also be a rotator cuff injury.  Wear sling periodically for support.  Take ibuprofen for pain.  Follow up with orthopedist if you notice no improvement in 1 week.    How to Use a Shoulder Immobilizer A shoulder immobilizer is a device that you may have to wear after a shoulder injury or surgery. This device keeps your arm from moving. This prevents additional pain or injury. It also supports your arm next to your body as your shoulder heals. You may need to wear a shoulder immobilizer to treat a broken bone (fracture) in your shoulder. You may also need to wear one if you have an injury that moves your shoulder out of position (dislocation). There are different types of shoulder immobilizers. The one that you get depends on your injury. RISKS AND COMPLICATIONS Wearing a shoulder immobilizer in the wrong way can let your injured shoulder move around too much. This may delay healing and make your pain and swelling worse. HOW TO USE YOUR SHOULDER IMMOBILIZER  The part of the immobilizer that goes around your neck (sling) should support your upper arm, with your elbow bent and your lower arm and hand across your chest.  Make sure that your elbow:  Is snug against the back pocket of the sling.  Does not move away from your body.  The strap of the immobilizer should go over your shoulder and support your arm and hand. Your hand should be slightly higher than your elbow. It should not hang loosely over the edge of the sling.  If the long strap has a pad, place it where it is most comfortable on your neck.  Carefully follow your health care provider's instructions for wearing your shoulder immobilizer. Your health care provider may want you to:  Loosen your immobilizer to straighten your elbow and move your wrist and fingers. You may have to do this several times each day. Ask your health care provider when you should do this and how  often.  Remove your immobilizer once every day to shower, but limit the movement in your injured arm. Before putting the immobilizer back on, use a towel to dry the area under your arm completely.  Remove your immobilizer to do shoulder exercises at home as directed by your health care provider.  Wear your immobilizer while you sleep. You may sleep more comfortably if you have your upper body raised on pillows. SEEK MEDICAL CARE IF:  Your immobilizer is not supporting your arm properly.  Your immobilizer gets damaged.  You have worsening pain or swelling in your shoulder, arm, or hand.  Your shoulder, arm, or hand changes color or temperature.  You lose feeling in your shoulder, arm, or hand.   This information is not intended to replace advice given to you by your health care provider. Make sure you discuss any questions you have with your health care provider.   Document Released: 05/10/2004 Document Revised: 08/17/2014 Document Reviewed: 03/10/2014 Elsevier Interactive Patient Education 2016 [place] for Routine Care of Injuries Theroutine careofmanyinjuriesincludes rest, ice, compression, and elevation (RICE therapy). RICE therapy is often recommended for injuries to soft tissues, such as a muscle strain, ligament injuries, bruises, and overuse injuries. It can also be used for some bony injuries. Using RICE therapy can help to relieve pain, lessen swelling, and enable your body to heal. Rest Rest is required to allow your body to heal. This usually involves reducing your normal  activities and avoiding use of the injured part of your body. Generally, you can return to your normal activities when you are comfortable and have been given permission by your health care provider. Ice Icing your injury helps to keep the swelling down, and it lessens pain. Do not apply ice directly to your skin.  Put ice in a plastic bag.  Place a towel between your skin and the  bag.  Leave the ice on for 20 minutes, 2-3 times a day. Do this for as long as you are directed by your health care provider. Compression Compression means putting pressure on the injured area. Compression helps to keep swelling down, gives support, and helps with discomfort. Compression may be done with an elastic bandage. If an elastic bandage has been applied, follow these general tips:  Remove and reapply the bandage every 3-4 hours or as directed by your health care provider.  Make sure the bandage is not wrapped too tightly, because this can cut off circulation. If part of your body beyond the bandage becomes blue, numb, cold, swollen, or more painful, your bandage is most likely too tight. If this occurs, remove your bandage and reapply it more loosely.  See your health care provider if the bandage seems to be making your problems worse rather than better. Elevation Elevation means keeping the injured area raised. This helps to lessen swelling and decrease pain. If possible, your injured area should be elevated at or above the level of your heart or the center of your chest. [place]? You should seek medical care if:  Your pain and swelling continue.  Your symptoms are getting worse rather than improving. These symptoms may indicate that further evaluation or further X-rays are needed. Sometimes, X-rays may not show a small broken bone (fracture) until a number of days later. Make a follow-up appointment with your health care provider. WHEN SHOULD I SEEK IMMEDIATE MEDICAL CARE? You should seek immediate medical care if:  You have sudden severe pain at or below the area of your injury.  You have redness or increased swelling around your injury.  You have tingling or numbness at or below the area of your injury that does not improve after you remove the elastic bandage.   This information is not intended to replace advice given to you by your health care  provider. Make sure you discuss any questions you have with your health care provider.   Document Released: 07/15/2000 Document Revised: 12/22/2014 Document Reviewed: 03/10/2014 Elsevier Interactive Patient Education [employer].

## 2015-08-27 ENCOUNTER — Encounter (HOSPITAL_BASED_OUTPATIENT_CLINIC_OR_DEPARTMENT_OTHER): Payer: Self-pay | Admitting: *Deleted

## 2015-08-27 ENCOUNTER — Emergency Department (HOSPITAL_BASED_OUTPATIENT_CLINIC_OR_DEPARTMENT_OTHER)
Admission: EM | Admit: 2015-08-27 | Discharge: 2015-08-27 | Disposition: A | Discharge status: Home / Self Care | Payer: Medicaid Other | Attending: Emergency Medicine | Admitting: Emergency Medicine

## 2015-08-27 DIAGNOSIS — F17210 Nicotine dependence, cigarettes, uncomplicated: Secondary | ICD-10-CM | POA: Diagnosis not present

## 2015-08-27 DIAGNOSIS — J029 Acute pharyngitis, unspecified: Secondary | ICD-10-CM | POA: Diagnosis not present

## 2015-08-27 LAB — RAPID STREP SCREEN (MED CTR MEBANE ONLY): Streptococcus, Group A Screen (Direct): NEGATIVE

## 2015-08-27 MED ORDER — SUCRALFATE 1 GM/10ML PO SUSP: 1.0000 g | Freq: Two times a day (BID) | ORAL | Status: DC

## 2015-08-27 NOTE — ED Provider Notes (Signed)
CSN: [id number]     Arrival date & time 08/27/15  1735 History   First MD Initiated Contact with Patient 08/27/15 1748     Chief Complaint  Patient presents with  . Sore Throat   (Consider location/radiation/quality/duration/timing/severity/associated sxs/prior Treatment) Patient is a 25 y.o. female presenting with pharyngitis. The history is provided by the patient and medical records. No language interpreter was used.  Sore Throat Associated symptoms include a sore throat. Pertinent negatives include no abdominal pain, chest pain, congestion, coughing, fever, nausea or vomiting.   Sharon Pearson is a 25 y.o. female  who presents to the Emergency Department complaining of Sore throat that began very late last night. Patient states the back of her throat feels "itchy". Sore throat became worse after smoking cigarettes and after eating chicken sandwich very quickly. No alleviating factors noted. Denies cough, congestion, shortness of breath, fever, chest pain. No medications taken prior to arrival for symptoms. Able to drink fluids without difficulty.  Past Medical History  Diagnosis Date  . STD (female)    History reviewed. No pertinent past surgical history. No family history on file. Social History  Substance Use Topics  . Smoking status: Current Every Day Smoker -- 1.00 packs/day    Types: Cigarettes  . Smokeless tobacco: Never Used  . Alcohol Use: Yes     Comment: rare   OB History    Gravida Para Term Preterm AB TAB SAB Ectopic Multiple Living   1    1          Review of Systems  Constitutional: Negative for fever.  HENT: Positive for sore throat. Negative for congestion.   Respiratory: Negative for cough, shortness of breath and wheezing.   Cardiovascular: Negative for chest pain.  Gastrointestinal: Negative for nausea, vomiting and abdominal pain.      Allergies  Review of patient's allergies indicates no known allergies.  Home Medications   Prior to Admission  medications   Medication Sig Start Date End Date Taking? Authorizing Provider  azithromycin (ZITHROMAX) 250 MG tablet Take 1 tablet (250 mg total) by mouth daily. 04/25/15   Everlene Balls, MD  doxycycline (VIBRAMYCIN) 100 MG capsule Take 1 capsule (100 mg total) by mouth 2 (two) times daily. One po bid x 7 days 05/28/15   Deno Etienne, DO  ibuprofen (ADVIL,MOTRIN) 600 MG tablet Take 1 tablet (600 mg total) by mouth every 6 (six) hours as needed. 06/22/15   Domenic Moras, PA-C  metroNIDAZOLE (FLAGYL) 500 MG tablet Take 1 tablet (500 mg total) by mouth 2 (two) times daily. One po bid x 7 days 05/28/15   Deno Etienne, DO  sucralfate (CARAFATE) 1 GM/10ML suspension Take 10 mLs (1 g total) by mouth 2 (two) times daily. 08/27/15   Jaime Pilcher Ward, PA-C   BP 118/61 mmHg  Pulse 61  Temp(Src) 99.3 F (37.4 C) (Oral)  Resp 16  Ht 5\' 2"  (1.575 m)  Wt 63.504 kg  BMI 25.60 kg/m2  SpO2 98%  LMP 08/21/2015 Physical Exam  Constitutional: She is oriented to person, place, and time. She appears well-developed and well-nourished. No distress.  HENT:  Head: Normocephalic and atraumatic.  OP with erythema, no exudates or tonsillar hypertrophy.   Neck: Normal range of motion. Neck supple.  No meningeal signs.   Cardiovascular: Normal rate, regular rhythm and normal heart sounds.   Pulmonary/Chest: Effort normal.  Lungs are clear to auscultation bilaterally - no w/r/r  Abdominal: Soft. She exhibits no distension. There is no  tenderness.  Musculoskeletal: Normal range of motion.  Neurological: She is alert and oriented to person, place, and time.  Skin: Skin is warm and dry. She is not diaphoretic.  Nursing note and vitals reviewed.   ED Course  Procedures (including critical care time) Labs Review Labs Reviewed  RAPID STREP SCREEN (NOT AT [facility])    Imaging Review No results found. I have personally reviewed and evaluated these images and lab results as part of my medical decision-making.   EKG  Interpretation None      MDM   Final diagnoses:  Sore throat   Sharon Pearson presents to ED for itchy, scratchy, sore throat that began late last night/early this morning. No cold symptoms. Sore throat was exacerbated after smoking cigarettes. On exam, oropharynx with erythema but no exudates or hypertrophy. Lungs are clear bilaterally. Able to drink fluids in ED without difficulty. Ddx gerd vs. Pharyngitis. Will give rx for Carafate. Return precautions and follow up care discussed. All questions answered.     [facility] Ward, PA-C 08/27/15 1824  Charlesetta Shanks, MD 08/27/15 [phone]

## 2015-08-27 NOTE — ED Notes (Signed)
Pt reports sore throat this morning. ?acid reflux. Denies known fever

## 2015-08-27 NOTE — ED Notes (Signed)
Speech normal, no complaints of difficult swallowing.

## 2015-08-27 NOTE — Discharge Instructions (Signed)
Take medication as directed. If symptoms are still present on Monday morning, call the GI clinic listed to schedule a follow up appointment.  Return to ER for shortness of breath, pain in chest, new or worsening symptoms, any additional concerns.

## 2015-08-27 NOTE — ED Notes (Signed)
Here with c/o sore throat, feels very "itching", denies any fever or cough. Able to eat and drink without difficulty. Throat appears red near uvula, but pt has been drinking Cheerwine soft drink in exam room.

## 2015-08-30 LAB — CULTURE, GROUP A STREP (THRC)

## 2016-02-08 ENCOUNTER — Emergency Department (HOSPITAL_BASED_OUTPATIENT_CLINIC_OR_DEPARTMENT_OTHER): Payer: Medicaid Other

## 2016-02-08 ENCOUNTER — Encounter (HOSPITAL_BASED_OUTPATIENT_CLINIC_OR_DEPARTMENT_OTHER): Payer: Self-pay

## 2016-02-08 ENCOUNTER — Emergency Department (HOSPITAL_BASED_OUTPATIENT_CLINIC_OR_DEPARTMENT_OTHER)
Admission: EM | Admit: 2016-02-08 | Discharge: 2016-02-08 | Disposition: A | Discharge status: Home / Self Care | Payer: Medicaid Other | Attending: Emergency Medicine | Admitting: Emergency Medicine

## 2016-02-08 DIAGNOSIS — O2341 Unspecified infection of urinary tract in pregnancy, first trimester: Secondary | ICD-10-CM | POA: Diagnosis not present

## 2016-02-08 DIAGNOSIS — Z3A01 Less than 8 weeks gestation of pregnancy: Secondary | ICD-10-CM | POA: Diagnosis not present

## 2016-02-08 DIAGNOSIS — F17210 Nicotine dependence, cigarettes, uncomplicated: Secondary | ICD-10-CM | POA: Diagnosis not present

## 2016-02-08 DIAGNOSIS — O23511 Infections of cervix in pregnancy, first trimester: Secondary | ICD-10-CM | POA: Diagnosis not present

## 2016-02-08 DIAGNOSIS — O99331 Smoking (tobacco) complicating pregnancy, first trimester: Secondary | ICD-10-CM | POA: Insufficient documentation

## 2016-02-08 DIAGNOSIS — N72 Inflammatory disease of cervix uteri: Secondary | ICD-10-CM

## 2016-02-08 DIAGNOSIS — O200 Threatened abortion: Secondary | ICD-10-CM

## 2016-02-08 DIAGNOSIS — O209 Hemorrhage in early pregnancy, unspecified: Secondary | ICD-10-CM | POA: Diagnosis present

## 2016-02-08 LAB — ABO/RH: ABO/RH(D): B POS

## 2016-02-08 LAB — URINALYSIS, ROUTINE W REFLEX MICROSCOPIC
Glucose, UA: NEGATIVE mg/dL
KETONES UR: 40 mg/dL — AB
NITRITE: POSITIVE — AB
SPECIFIC GRAVITY, URINE: 1.028 (ref 1.005–1.030)
pH: 5 (ref 5.0–8.0)

## 2016-02-08 LAB — URINE MICROSCOPIC-ADD ON

## 2016-02-08 LAB — WET PREP, GENITAL
SPERM: NONE SEEN
TRICH WET PREP: NONE SEEN
Yeast Wet Prep HPF POC: NONE SEEN

## 2016-02-08 LAB — PREGNANCY, URINE: PREG TEST UR: POSITIVE — AB

## 2016-02-08 LAB — HCG, QUANTITATIVE, PREGNANCY: HCG, BETA CHAIN, QUANT, S: 2647 m[IU]/mL — AB (ref <5)

## 2016-02-08 MED ORDER — CEFTRIAXONE SODIUM 250 MG IJ SOLR
250.0000 mg | Freq: Once | INTRAMUSCULAR | Status: AC
  Administered 2016-02-08: 250 mg via INTRAMUSCULAR

## 2016-02-08 MED ORDER — DEXTROSE 5 % IV SOLN
1.0000 g | Freq: Once | INTRAVENOUS | Status: DC
  Filled 2016-02-08: qty 10

## 2016-02-08 MED ORDER — CEPHALEXIN 500 MG PO CAPS: 500.0000 mg | ORAL_CAPSULE | Freq: Three times a day (TID) | ORAL | 0 refills | Status: AC

## 2016-02-08 MED ORDER — AZITHROMYCIN 250 MG PO TABS
1000.0000 mg | ORAL_TABLET | Freq: Once | ORAL | Status: AC
  Administered 2016-02-08: 1000 mg via ORAL
  Filled 2016-02-08: qty 4

## 2016-02-08 MED ORDER — LIDOCAINE HCL (PF) 1 % IJ SOLN
INTRAMUSCULAR | Status: AC
  Filled 2016-02-08: qty 5

## 2016-02-08 NOTE — ED Triage Notes (Signed)
Vaginal bleeding x today-3 pos home preg test this weekend-LMP 9/14

## 2016-02-08 NOTE — ED Provider Notes (Signed)
[facility] Provider Note   CSN: [id number] Arrival date & time: 02/08/16  1743  By signing my name below, I, Delton Prairie, attest that this documentation has been prepared under the direction and in the presence of Julianne Rice, MD  Electronically Signed: Delton Prairie, ED Scribe. 02/08/16. 6:39 PM.   History   Chief Complaint Chief Complaint  Patient presents with  . Vaginal Bleeding    The history is provided by the patient. No language interpreter was used.   HPI Comments:  Sharon Pearson is a 25 y.o. female who presents to the Emergency Department complaining of sudden onset vaginal bleeding which began around 9:50 AM today. Pt states she went to use the bathroom when Passed a large blood clot. She states her bleeding is similar to a menstrual cycle. Pt states she noticed light brown discharge prior to bleeding. Last menstrual period was on 09/14 and she notes she has taken three pregnancy tests which have been positive. Associated symptoms include abdominal pain. She denies fevers, chills, urgency, dysuria, and other urinary symptoms. Pt has had 1 prior pregnancy for which she had an abortion in 01/17. No alleviating factors noted. Pt denies any other complaints at this time.  Past Medical History:  Diagnosis Date  . STD (female)     There are no active problems to display for this patient.   Past Surgical History:  Procedure Laterality Date  . INDUCED ABORTION      OB History    Gravida Para Term Preterm AB Living   1       1     SAB TAB Ectopic Multiple Live Births                   Home Medications    Prior to Admission medications   Medication Sig Start Date End Date Taking? Authorizing Provider  cephALEXin (KEFLEX) 500 MG capsule Take 1 capsule (500 mg total) by mouth 3 (three) times daily. 02/08/16   Julianne Rice, MD    Family History No family history on file.  Social History Social History  Substance Use Topics  . Smoking  status: Current Every Day Smoker    Packs/day: 1.00    Types: Cigarettes  . Smokeless tobacco: Never Used  . Alcohol use Yes     Comment: occ     Allergies   Review of patient's allergies indicates no known allergies.   Review of Systems Review of Systems  Constitutional: Negative for chills and fever.  Respiratory: Negative for shortness of breath.   Cardiovascular: Negative for chest pain.  Gastrointestinal: Positive for abdominal pain. Negative for diarrhea, nausea and vomiting.  Genitourinary: Positive for frequency, pelvic pain, vaginal bleeding and vaginal discharge. Negative for dysuria, flank pain and hematuria.  Musculoskeletal: Negative for back pain and myalgias.  Neurological: Negative for dizziness, weakness, light-headedness, numbness and headaches.  All other systems reviewed and are negative.    Physical Exam Updated Vital Signs BP 112/64 (BP Location: Right Arm)   Pulse 72   Temp 98.3 F (36.8 C) (Oral)   Resp 18   Ht 5\' 2"  (1.575 m)   Wt 150 lb (68 kg)   LMP 12/29/2015   SpO2 100%   BMI 27.44 kg/m   Physical Exam  Constitutional: She is oriented to person, place, and time. She appears well-developed and well-nourished.  HENT:  Head: Normocephalic and atraumatic.  Mouth/Throat: Oropharynx is clear and moist.  Eyes: EOM are normal. Pupils are equal,  round, and reactive to light.  Neck: Normal range of motion. Neck supple.  Cardiovascular: Normal rate and regular rhythm.   Pulmonary/Chest: Effort normal and breath sounds normal.  Abdominal: Soft. Bowel sounds are normal. She exhibits no distension and no mass. There is tenderness (very mild suprapubic tenderness with palpation. No rebound or guarding.). There is no rebound and no guarding.  Musculoskeletal: Normal range of motion. She exhibits no edema or tenderness.  No CVA tenderness bilaterally.  Neurological: She is alert and oriented to person, place, and time.  Moving all extremities without  deficit. Sensation is fully intact.  Skin: Skin is warm and dry. No rash noted. No erythema.  Psychiatric: She has a normal mood and affect. Her behavior is normal.  Nursing note and vitals reviewed.    ED Treatments / Results  DIAGNOSTIC STUDIES:  Oxygen Saturation is 100% on RA, normal by my interpretation.    COORDINATION OF CARE:  6:32 PM Discussed treatment plan with pt at bedside and pt agreed to plan.  Labs (all labs ordered are listed, but only abnormal results are displayed) Labs Reviewed  WET PREP, GENITAL - Abnormal; Notable for the following:       Result Value   Clue Cells Wet Prep HPF POC PRESENT (*)    WBC, Wet Prep HPF POC MODERATE (*)    All other components within normal limits  URINALYSIS, ROUTINE W REFLEX MICROSCOPIC (NOT AT [facility]) - Abnormal; Notable for the following:    Color, Urine RED (*)    APPearance TURBID (*)    Hgb urine dipstick LARGE (*)    Bilirubin Urine LARGE (*)    Ketones, ur 40 (*)    Protein, ur >300 (*)    Nitrite POSITIVE (*)    Leukocytes, UA MODERATE (*)    All other components within normal limits  PREGNANCY, URINE - Abnormal; Notable for the following:    Preg Test, Ur POSITIVE (*)    All other components within normal limits  HCG, QUANTITATIVE, PREGNANCY - Abnormal; Notable for the following:    hCG, Beta Chain, Quant, S 2,647 (*)    All other components within normal limits  URINE MICROSCOPIC-ADD ON - Abnormal; Notable for the following:    Squamous Epithelial / LPF 6-30 (*)    Bacteria, UA FEW (*)    All other components within normal limits  ABO/RH  GC/CHLAMYDIA PROBE AMP ([place]) NOT AT [facility]  SURGICAL PATHOLOGY    EKG  EKG Interpretation None       Radiology US Ob Comp Less 14 Wks  Result Date: 02/08/2016 CLINICAL DATA:  Sudden onset of diffuse cramping. First-trimester pregnancy. EXAM: OBSTETRIC <14 WK [place] AND TRANSVAGINAL OB US TECHNIQUE: Both transabdominal and transvaginal ultrasound examinations were  performed for complete evaluation of the gestation as well as the maternal uterus, adnexal regions, and pelvic cul-de-sac. Transvaginal technique was performed to assess early pregnancy. COMPARISON:  None. FINDINGS: Intrauterine gestational sac: A single gestational sac is present within the lower uterine segment/ upper cervix Yolk sac:  Present Embryo:  Not present Cardiac Activity: Heart Rate:   bpm MSD: 5  mm   5 w   2  d Subchorionic hemorrhage: Extensive hemorrhage is present superior to the gestational sac within the endometrial cavity just above the gestational sac. Maternal uterus/adnexae: An echogenic masslike area in the fundus of the uterus measures 4.4 x 4.9 x 3.7 cm. This likely represents a fibroid rather than additional hemorrhage. This could represent extensive  hemorrhage within the fundus. The adnexae are within normal limits. IMPRESSION: 1. Gestational sac with the yolk sac positioned within the lower uterine segment/upper cervix. This likely represents an abortion in progress. 2. Debris above the gestational sac likely represents hemorrhage. 3. Large hyperechoic area at the fundus likely represents a fibroid. Additional hemorrhage is considered less likely. Electronically Signed   By: San Morelle M.D.   On: 02/08/2016 20:00   US Ob Transvaginal  Result Date: 02/08/2016 CLINICAL DATA:  Sudden onset of diffuse cramping. First-trimester pregnancy. EXAM: OBSTETRIC <14 WK [place] AND TRANSVAGINAL OB US TECHNIQUE: Both transabdominal and transvaginal ultrasound examinations were performed for complete evaluation of the gestation as well as the maternal uterus, adnexal regions, and pelvic cul-de-sac. Transvaginal technique was performed to assess early pregnancy. COMPARISON:  None. FINDINGS: Intrauterine gestational sac: A single gestational sac is present within the lower uterine segment/ upper cervix Yolk sac:  Present Embryo:  Not present Cardiac Activity: Heart Rate:   bpm MSD: 5  mm   5 w    2  d Subchorionic hemorrhage: Extensive hemorrhage is present superior to the gestational sac within the endometrial cavity just above the gestational sac. Maternal uterus/adnexae: An echogenic masslike area in the fundus of the uterus measures 4.4 x 4.9 x 3.7 cm. This likely represents a fibroid rather than additional hemorrhage. This could represent extensive hemorrhage within the fundus. The adnexae are within normal limits. IMPRESSION: 1. Gestational sac with the yolk sac positioned within the lower uterine segment/upper cervix. This likely represents an abortion in progress. 2. Debris above the gestational sac likely represents hemorrhage. 3. Large hyperechoic area at the fundus likely represents a fibroid. Additional hemorrhage is considered less likely. Electronically Signed   By: San Morelle M.D.   On: 02/08/2016 20:00    Procedures Procedures (including critical care time)  Medications Ordered in ED Medications  lidocaine (PF) (XYLOCAINE) 1 % injection (not administered)  azithromycin (ZITHROMAX) tablet 1,000 mg (not administered)  cefTRIAXone (ROCEPHIN) injection 250 mg (250 mg Intramuscular Given 02/08/16 2104)     Initial Impression / Assessment and Plan / ED Course  I have reviewed the triage vital signs and the nursing notes.  Pertinent labs & imaging results that were available during my care of the patient were reviewed by me and considered in my medical decision making (see chart for details).  Clinical Course   Patient treated for cervicitis with IM Rocephin and azithromycin in the emergency department. Also questionable UTI. Will start on Keflex. Patient stated she did not need RhoGAM with her previous abortion. ABO/Rh sent to lab. She understands the need to follow-up with OB in 2 days to recheck hCG level. She also understands need to return immediately for any fever, worsening pain, excessive bleeding or any concerns.   Final Clinical Impressions(s) / ED  Diagnoses   Final diagnoses:  Threatened abortion  Urinary tract infection in mother during first trimester of pregnancy  Cervicitis    New Prescriptions New Prescriptions   CEPHALEXIN (KEFLEX) 500 MG CAPSULE    Take 1 capsule (500 mg total) by mouth 3 (three) times daily.  I personally performed the services described in this documentation, which was scribed in my presence. The recorded information has been reviewed and is accurate.       Julianne Rice, MD 02/08/16 2117

## 2016-02-08 NOTE — ED Notes (Signed)
Patient transported to Ultrasound 

## 2016-02-09 LAB — GC/CHLAMYDIA PROBE AMP (~~LOC~~) NOT AT ARMC
CHLAMYDIA, DNA PROBE: NEGATIVE
NEISSERIA GONORRHEA: NEGATIVE

## 2016-04-07 ENCOUNTER — Encounter (HOSPITAL_BASED_OUTPATIENT_CLINIC_OR_DEPARTMENT_OTHER): Payer: Self-pay | Admitting: Emergency Medicine

## 2016-04-07 ENCOUNTER — Emergency Department (HOSPITAL_BASED_OUTPATIENT_CLINIC_OR_DEPARTMENT_OTHER): Payer: Medicaid Other

## 2016-04-07 ENCOUNTER — Emergency Department (HOSPITAL_BASED_OUTPATIENT_CLINIC_OR_DEPARTMENT_OTHER)
Admission: EM | Admit: 2016-04-07 | Discharge: 2016-04-07 | Disposition: A | Discharge status: Home / Self Care | Payer: Medicaid Other | Source: Home / Self Care

## 2016-04-07 ENCOUNTER — Emergency Department (HOSPITAL_BASED_OUTPATIENT_CLINIC_OR_DEPARTMENT_OTHER)
Admission: EM | Admit: 2016-04-07 | Discharge: 2016-04-07 | Disposition: A | Discharge status: Home / Self Care | Payer: Medicaid Other | Attending: Emergency Medicine | Admitting: Emergency Medicine

## 2016-04-07 DIAGNOSIS — Z3201 Encounter for pregnancy test, result positive: Secondary | ICD-10-CM

## 2016-04-07 DIAGNOSIS — O99331 Smoking (tobacco) complicating pregnancy, first trimester: Secondary | ICD-10-CM | POA: Diagnosis not present

## 2016-04-07 DIAGNOSIS — O23591 Infection of other part of genital tract in pregnancy, first trimester: Secondary | ICD-10-CM | POA: Insufficient documentation

## 2016-04-07 DIAGNOSIS — F17210 Nicotine dependence, cigarettes, uncomplicated: Secondary | ICD-10-CM | POA: Insufficient documentation

## 2016-04-07 DIAGNOSIS — N898 Other specified noninflammatory disorders of vagina: Secondary | ICD-10-CM

## 2016-04-07 DIAGNOSIS — R102 Pelvic and perineal pain: Secondary | ICD-10-CM | POA: Diagnosis not present

## 2016-04-07 DIAGNOSIS — Z202 Contact with and (suspected) exposure to infections with a predominantly sexual mode of transmission: Secondary | ICD-10-CM

## 2016-04-07 DIAGNOSIS — O26899 Other specified pregnancy related conditions, unspecified trimester: Secondary | ICD-10-CM

## 2016-04-07 DIAGNOSIS — B9689 Other specified bacterial agents as the cause of diseases classified elsewhere: Secondary | ICD-10-CM

## 2016-04-07 DIAGNOSIS — Z5321 Procedure and treatment not carried out due to patient leaving prior to being seen by health care provider: Secondary | ICD-10-CM | POA: Insufficient documentation

## 2016-04-07 DIAGNOSIS — N760 Acute vaginitis: Secondary | ICD-10-CM

## 2016-04-07 DIAGNOSIS — Z3A01 Less than 8 weeks gestation of pregnancy: Secondary | ICD-10-CM | POA: Insufficient documentation

## 2016-04-07 DIAGNOSIS — Z3A00 Weeks of gestation of pregnancy not specified: Secondary | ICD-10-CM | POA: Insufficient documentation

## 2016-04-07 LAB — URINALYSIS, ROUTINE W REFLEX MICROSCOPIC
BILIRUBIN URINE: NEGATIVE
GLUCOSE, UA: NEGATIVE mg/dL
HGB URINE DIPSTICK: NEGATIVE
Ketones, ur: 15 mg/dL — AB
Leukocytes, UA: NEGATIVE
Nitrite: NEGATIVE
Protein, ur: NEGATIVE mg/dL
SPECIFIC GRAVITY, URINE: 1.026 (ref 1.005–1.030)
pH: 6 (ref 5.0–8.0)

## 2016-04-07 LAB — WET PREP, GENITAL
Sperm: NONE SEEN
TRICH WET PREP: NONE SEEN
YEAST WET PREP: NONE SEEN

## 2016-04-07 LAB — HCG, QUANTITATIVE, PREGNANCY: hCG, Beta Chain, Quant, S: 608 m[IU]/mL — ABNORMAL HIGH (ref <5)

## 2016-04-07 LAB — PREGNANCY, URINE: PREG TEST UR: POSITIVE — AB

## 2016-04-07 MED ORDER — CEFTRIAXONE SODIUM 250 MG IJ SOLR
250.0000 mg | Freq: Once | INTRAMUSCULAR | Status: AC
  Administered 2016-04-07: 250 mg via INTRAMUSCULAR
  Filled 2016-04-07: qty 250

## 2016-04-07 MED ORDER — LIDOCAINE HCL (PF) 1 % IJ SOLN
INTRAMUSCULAR | Status: AC
  Administered 2016-04-07: 1 mL via INTRAMUSCULAR
  Filled 2016-04-07: qty 5

## 2016-04-07 MED ORDER — AZITHROMYCIN 250 MG PO TABS
1000.0000 mg | ORAL_TABLET | Freq: Once | ORAL | Status: AC
  Administered 2016-04-07: 1000 mg via ORAL
  Filled 2016-04-07: qty 4

## 2016-04-07 MED ORDER — METRONIDAZOLE 500 MG PO TABS: 500.0000 mg | ORAL_TABLET | Freq: Two times a day (BID) | ORAL | 0 refills | Status: AC

## 2016-04-07 NOTE — ED Triage Notes (Signed)
Patient was here earlier for vaginal burning and an STD check. Patient states that she had to leave because she forgot the medication would make her sleepy and she needed a ride. Reports no changes

## 2016-04-07 NOTE — ED Notes (Addendum)
Patient no longer in the waiting room. Called x 3
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
## 2016-04-07 NOTE — Discharge Instructions (Signed)
Your pregnancy test was positive today. There was not a definitive pregnancy seen on the ultrasound. It is very important that he follow-up for a repeat quantitative hCG test performed at a hospital in 48 hours. This is to assure that the pregnancy hormone level is decreasing. If it is increasing, it could mean that there is what is known as an ectopic pregnancy which, if left untreated, can cause serious harm or death.  You have evidence of bacterial vaginosis. This could be the cause of the vaginal discharge. Please take all of your antibiotics until finished!   You may develop abdominal discomfort or diarrhea from the antibiotic.  You may help offset this with probiotics which you can buy or get in yogurt. Do not eat or take the probiotics until 2 hours after your antibiotic.

## 2016-04-07 NOTE — ED Notes (Signed)
Urine was sent to lab.

## 2016-04-07 NOTE — ED Notes (Signed)
Pt given d/c instructions as per chart. Verbalizes understanding. No questions. Rx x 1 with precautions 

## 2016-04-09 ENCOUNTER — Encounter (HOSPITAL_COMMUNITY): Payer: Self-pay | Admitting: *Deleted

## 2016-04-09 ENCOUNTER — Inpatient Hospital Stay (HOSPITAL_COMMUNITY)
Admission: AD | Admit: 2016-04-09 | Discharge: 2016-04-09 | Disposition: A | Discharge status: Home / Self Care | Payer: Medicaid Other | Source: Ambulatory Visit | Attending: Obstetrics & Gynecology | Admitting: Obstetrics & Gynecology

## 2016-04-09 DIAGNOSIS — Z3201 Encounter for pregnancy test, result positive: Secondary | ICD-10-CM | POA: Diagnosis not present

## 2016-04-09 DIAGNOSIS — O3680X0 Pregnancy with inconclusive fetal viability, not applicable or unspecified: Secondary | ICD-10-CM

## 2016-04-09 HISTORY — DX: Gonococcal infection, unspecified: A54.9

## 2016-04-09 HISTORY — DX: Trichomoniasis, unspecified: A59.9

## 2016-04-09 HISTORY — DX: Unspecified infectious disease: B99.9

## 2016-04-09 HISTORY — DX: Chlamydial infection, unspecified: A74.9

## 2016-04-09 LAB — HIV ANTIBODY (ROUTINE TESTING W REFLEX): HIV SCREEN 4TH GENERATION: NONREACTIVE

## 2016-04-09 LAB — RPR: RPR: NONREACTIVE

## 2016-04-09 LAB — HCG, QUANTITATIVE, PREGNANCY: HCG, BETA CHAIN, QUANT, S: 1360 m[IU]/mL — AB (ref <5)

## 2016-04-09 NOTE — MAU Note (Signed)
Doing ok, just feels a little sick. Having cramping like she is going to start her period, no bleeding.  Bleeding in Nov was the only bleeding she had after dx with miscarriage.

## 2016-04-09 NOTE — MAU Provider Note (Signed)
History   [id number]   Chief Complaint  Patient presents with  . Follow-up    HPI Sharon Pearson is a 25 y.o. female G1P0010 here for follow-up BHCG.  Upon review of the records patient was first seen on 04/07/16 at ED for vaginal discharge and suprapubic pain and concern for STD exposure.   BHCG on that day was 608.  Ultrasound showed an 2.7 x 2.1 x 2.2 cm complex predominantly cystic structure in the inferior pole of the right ovary with layering echogenic debris. A 2.2 x 1.9 x 2.1 cm complex lesion with central cysticcomponent noted in the superior aspect of the right ovary, not suspected to be an ectopic pregnancy.    Wet prep collected results were +clue cells.   Pt treated with rocephin and zithromax while in ED and sent home with flagyl and Keflex.  Final diagnoses were: Exposure to STD, Positive Pregnancy Test and Bacterial Vaginosis.   Pt here today with report of low pelvic cramping, increased nausea, and no vaginal bleeding.   All other systems negative.   Patient's last menstrual period was 02/29/2016.  OB History  Gravida Para Term Preterm AB Living  1       1    SAB TAB Ectopic Multiple Live Births               # Outcome Date GA Lbr Len/2nd Weight Sex Delivery Anes PTL Lv  1 AB 04/23/15              Past Medical History:  Diagnosis Date  . STD (female)     No family history on file.  Social History   Social History  . Marital status: Single    Spouse name: N/A  . Number of children: N/A  . Years of education: N/A   Social History Main Topics  . Smoking status: Current Every Day Smoker    Packs/day: 1.00    Types: Cigarettes  . Smokeless tobacco: Never Used  . Alcohol use Yes     Comment: occ  . Drug use:     Types: Marijuana     Comment: denies  . Sexual activity: Not on file   Other Topics Concern  . Not on file   Social History Narrative  . No narrative on file    No Known Allergies  No current facility-administered medications on file  prior to encounter.    Current Outpatient Prescriptions on File Prior to Encounter  Medication Sig Dispense Refill  . cephALEXin (KEFLEX) 500 MG capsule Take 1 capsule (500 mg total) by mouth 3 (three) times daily. 21 capsule 0  . metroNIDAZOLE (FLAGYL) 500 MG tablet Take 1 tablet (500 mg total) by mouth 2 (two) times daily. 14 tablet 0     Physical Exam   Vitals:   04/09/16 1606  BP: 113/56  Pulse: 95  Resp: 16  Temp: 98.2 F (36.8 C)  TempSrc: Oral    Physical Exam  Constitutional: She is oriented to person, place, and time. She appears well-developed and well-nourished. No distress.  HENT:  Head: Normocephalic.  Neck: Neck supple.  Respiratory: Effort normal and breath sounds normal.  Neurological: She is alert and oriented to person, place, and time. She has normal reflexes.  Skin: Skin is warm and dry.  Psychiatric: She has a normal mood and affect.    MAU Course  Procedures  MDM Results for orders placed or performed during the hospital encounter of 04/09/16 (from the past 24  hour(s))  hCG, quantitative, pregnancy     Status: Abnormal   Collection Time: 04/09/16  3:31 PM  Result Value Ref Range   hCG, Beta Chain, Quant, S 1,360 (H) <5 mIU/mL     Assessment and Plan  25 y.o. G1P0010 at Unknown wks Pregnancy Follow-up BHCG Pregnancy of Unknown Location  Plan: Discharge home Ectopic precautions Follow-up ultrasound in 10 days Consulted with Dr. Harolyn Rutherford > agrees with plan of care  Gwen Pounds, CNM

## 2016-04-10 LAB — GC/CHLAMYDIA PROBE AMP (~~LOC~~) NOT AT ARMC
Chlamydia: POSITIVE — AB
Neisseria Gonorrhea: NEGATIVE

## 2016-04-17 ENCOUNTER — Ambulatory Visit: Payer: Medicaid Other | Admitting: *Deleted

## 2016-04-17 ENCOUNTER — Ambulatory Visit (HOSPITAL_COMMUNITY)
Admission: RE | Admit: 2016-04-17 | Discharge: 2016-04-17 | Disposition: A | Discharge status: Home / Self Care | Payer: Medicaid Other | Source: Ambulatory Visit | Attending: Family | Admitting: Family

## 2016-04-17 ENCOUNTER — Ambulatory Visit (HOSPITAL_COMMUNITY): Payer: Medicaid Other

## 2016-04-17 DIAGNOSIS — Z3A01 Less than 8 weeks gestation of pregnancy: Secondary | ICD-10-CM | POA: Insufficient documentation

## 2016-04-17 DIAGNOSIS — D259 Leiomyoma of uterus, unspecified: Secondary | ICD-10-CM | POA: Diagnosis not present

## 2016-04-17 DIAGNOSIS — Z3490 Encounter for supervision of normal pregnancy, unspecified, unspecified trimester: Secondary | ICD-10-CM

## 2016-04-17 DIAGNOSIS — O3411 Maternal care for benign tumor of corpus uteri, first trimester: Secondary | ICD-10-CM | POA: Diagnosis not present

## 2016-04-17 DIAGNOSIS — Z3689 Encounter for other specified antenatal screening: Secondary | ICD-10-CM | POA: Diagnosis present

## 2016-04-17 DIAGNOSIS — O3680X0 Pregnancy with inconclusive fetal viability, not applicable or unspecified: Secondary | ICD-10-CM

## 2016-04-17 NOTE — Progress Notes (Signed)
Pt in for ultrasound results. Reviewed with Dr. Roselie Awkward he recommended no repeat ultrasound. Pt may start prenatal care. Pt had no questions or concerns.

## 2016-04-18 ENCOUNTER — Ambulatory Visit: Payer: Medicaid Other

## 2017-02-12 ENCOUNTER — Encounter (HOSPITAL_COMMUNITY): Payer: Self-pay

## 2017-07-12 ENCOUNTER — Other Ambulatory Visit: Payer: Self-pay

## 2017-07-12 ENCOUNTER — Encounter (HOSPITAL_BASED_OUTPATIENT_CLINIC_OR_DEPARTMENT_OTHER): Payer: Self-pay | Admitting: *Deleted

## 2017-07-12 ENCOUNTER — Emergency Department (HOSPITAL_BASED_OUTPATIENT_CLINIC_OR_DEPARTMENT_OTHER): Payer: Medicaid Other

## 2017-07-12 ENCOUNTER — Emergency Department (HOSPITAL_BASED_OUTPATIENT_CLINIC_OR_DEPARTMENT_OTHER)
Admission: EM | Admit: 2017-07-12 | Discharge: 2017-07-12 | Disposition: A | Discharge status: Home / Self Care | Payer: Medicaid Other | Attending: Emergency Medicine | Admitting: Emergency Medicine

## 2017-07-12 DIAGNOSIS — S62662A Nondisplaced fracture of distal phalanx of right middle finger, initial encounter for closed fracture: Secondary | ICD-10-CM

## 2017-07-12 DIAGNOSIS — S6981XA Other specified injuries of right wrist, hand and finger(s), initial encounter: Secondary | ICD-10-CM | POA: Diagnosis present

## 2017-07-12 DIAGNOSIS — W51XXXA Accidental striking against or bumped into by another person, initial encounter: Secondary | ICD-10-CM | POA: Insufficient documentation

## 2017-07-12 DIAGNOSIS — Y929 Unspecified place or not applicable: Secondary | ICD-10-CM | POA: Insufficient documentation

## 2017-07-12 DIAGNOSIS — Z79899 Other long term (current) drug therapy: Secondary | ICD-10-CM | POA: Insufficient documentation

## 2017-07-12 DIAGNOSIS — Y999 Unspecified external cause status: Secondary | ICD-10-CM | POA: Insufficient documentation

## 2017-07-12 DIAGNOSIS — Y9389 Activity, other specified: Secondary | ICD-10-CM | POA: Insufficient documentation

## 2017-07-12 MED ORDER — ACETAMINOPHEN 500 MG PO TABS: 500.0000 mg | ORAL_TABLET | Freq: Four times a day (QID) | ORAL | 0 refills | Status: AC | PRN

## 2017-07-12 MED ORDER — IBUPROFEN 600 MG PO TABS: 600.0000 mg | ORAL_TABLET | Freq: Four times a day (QID) | ORAL | 0 refills | Status: AC | PRN

## 2017-07-12 NOTE — Discharge Instructions (Signed)
You can alternate ibuprofen and Tylenol as prescribed.  Use ice 3-4 times daily alternating 15 minutes on, 15 minutes off to help with the swelling and pain.  Wear splint at all times except with washing your hands or bathing.  Leave this on until you have been told otherwise by hand doctor.  Please follow-up with Dr. Fredna Dow, the hand doctor, for further evaluation and treatment of your symptoms.  Please return to the emergency department if you develop any new or worsening symptoms.
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
## 2017-11-21 ENCOUNTER — Other Ambulatory Visit: Payer: Self-pay

## 2017-11-21 ENCOUNTER — Encounter (HOSPITAL_BASED_OUTPATIENT_CLINIC_OR_DEPARTMENT_OTHER): Payer: Self-pay | Admitting: Emergency Medicine

## 2017-11-21 ENCOUNTER — Emergency Department (HOSPITAL_BASED_OUTPATIENT_CLINIC_OR_DEPARTMENT_OTHER)
Admission: EM | Admit: 2017-11-21 | Discharge: 2017-11-21 | Disposition: A | Discharge status: Home / Self Care | Payer: Medicaid Other | Attending: Emergency Medicine | Admitting: Emergency Medicine

## 2017-11-21 ENCOUNTER — Emergency Department (HOSPITAL_BASED_OUTPATIENT_CLINIC_OR_DEPARTMENT_OTHER): Payer: Medicaid Other

## 2017-11-21 DIAGNOSIS — M25571 Pain in right ankle and joints of right foot: Secondary | ICD-10-CM | POA: Diagnosis not present

## 2017-11-21 DIAGNOSIS — G8929 Other chronic pain: Secondary | ICD-10-CM | POA: Insufficient documentation

## 2017-11-21 DIAGNOSIS — F17210 Nicotine dependence, cigarettes, uncomplicated: Secondary | ICD-10-CM | POA: Insufficient documentation

## 2017-11-21 MED ORDER — KETOROLAC TROMETHAMINE 30 MG/ML IJ SOLN
30.0000 mg | Freq: Once | INTRAMUSCULAR | Status: AC
  Administered 2017-11-21: 30 mg via INTRAMUSCULAR
  Filled 2017-11-21: qty 1

## 2017-11-21 MED ORDER — NAPROXEN 500 MG PO TABS
500.0000 mg | ORAL_TABLET | Freq: Two times a day (BID) | ORAL | 0 refills | Status: DC
Start: 2017-11-21 — End: 2019-10-31

## 2017-11-21 MED FILL — NAPROXEN 500 MG TABLET: 500 | 15 days supply | Qty: 30 | Fill #0

## 2017-11-21 NOTE — ED Triage Notes (Signed)
R ankle pain x 3 days, denies injury

## 2017-11-21 NOTE — Discharge Instructions (Signed)
Return to ED for worsening symptoms, numbness in arms or legs, red hot or tender joint, injuries or falls.

## 2017-11-21 NOTE — ED Provider Notes (Signed)
[facility] EMERGENCY DEPARTMENT Provider Note   CSN: [id number] Arrival date & time: 11/21/17  1133     History   Chief Complaint Chief Complaint  Patient presents with  . Ankle Pain    HPI Sharon Pearson is a 27 y.o. female  who presents to ED for evaluation of right ankle pain for the past 3 days.  States that she will have intermittent similar pains every few weeks based on her activity level.  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  She believes this is exacerbating her pain.  She has tried Tylenol with no improvement in her symptoms.  She denies any recent injury or falls.  She denies any prior fracture, dislocations or procedures in the area.  Denies any recent surgeries, recent prolonged travel, leg pain, history of DVT. Patient denies possibility of pregnancy.  HPI  Past Medical History:  Diagnosis Date  . Chlamydia   . Gonorrhea   . Infection    UTI  . STD (female)   . Trichomonas infection     There are no active problems to display for this patient.   Past Surgical History:  Procedure Laterality Date  . INDUCED ABORTION    . NO PAST SURGERIES       OB History    Gravida  4   Para      Term      Preterm      AB  3   Living        SAB  1   TAB  1   Ectopic      Multiple      Live Births               Home Medications    Prior to Admission medications   Medication Sig Start Date End Date Taking? Authorizing Provider  acetaminophen (TYLENOL) 500 MG tablet Take 1 tablet (500 mg total) by mouth every 6 (six) hours as needed. 07/12/17   Law, Bea Graff, PA-C  cephALEXin (KEFLEX) 500 MG capsule Take 1 capsule (500 mg total) by mouth 3 (three) times daily. 02/08/16   Julianne Rice, MD  ibuprofen (ADVIL,MOTRIN) 600 MG tablet Take 1 tablet (600 mg total) by mouth every 6 (six) hours as needed. 07/12/17   Law, Bea Graff, PA-C  metroNIDAZOLE (FLAGYL) 500 MG  tablet Take 1 tablet (500 mg total) by mouth 2 (two) times daily. 04/07/16   Joy, Shawn C, PA-C  naproxen (NAPROSYN) 500 MG tablet Take 1 tablet (500 mg total) by mouth 2 (two) times daily. 11/21/17   Delia Heady, PA-C    Family History Family History  Problem Relation Age of Onset  . Cancer Mother        breast and lung  . Cancer Maternal Grandmother        throat cancer    Social History Social History   Tobacco Use  . Smoking status: Current Every Day Smoker    Packs/day: 1.00    Years: 7.00    Pack years: 7.00    Types: Cigarettes  . Smokeless tobacco: Never Used  Substance Use Topics  . Alcohol use: Yes    Comment: occ, none since + preg  . Drug use: Yes    Types: Marijuana    Comment: denies     Allergies   Patient has no known allergies.   Review of Systems Review of  Systems  Constitutional: Negative for chills and fever.  Musculoskeletal: Positive for arthralgias. Negative for gait problem, joint swelling and myalgias.  Skin: Negative for color change.  Neurological: Negative for weakness and numbness.     Physical Exam Updated Vital Signs BP 107/67 (BP Location: Right Arm)   Pulse 95   Temp 98.2 F (36.8 C) (Oral)   Resp 16   Ht 5\' 3"  (1.6 m)   Wt 70.1 kg   LMP 11/03/2017   SpO2 99%   BMI 27.38 kg/m   Physical Exam  Constitutional: She appears well-developed and well-nourished. No distress.  HENT:  Head: Normocephalic and atraumatic.  Eyes: Conjunctivae and EOM are normal. No scleral icterus.  Neck: Normal range of motion.  Pulmonary/Chest: Effort normal. No respiratory distress.  Musculoskeletal: Normal range of motion. She exhibits tenderness. She exhibits no edema or deformity.  Tenderness to palpation of the medial malleolus of the right ankle.  No changes to range of motion noted.  No erythema, edema or warmth of joint noted.  2+ DP pulse, sensation intact to light touch.  Neurological: She is alert.  Skin: No rash noted. She is not  diaphoretic.  Psychiatric: She has a normal mood and affect.  Nursing note and vitals reviewed.    ED Treatments / Results  Labs (all labs ordered are listed, but only abnormal results are displayed) Labs Reviewed - No data to display  EKG None  Radiology Dg Ankle Complete Right  Result Date: 11/21/2017 CLINICAL DATA:  Medial ankle pain for 2 days, no known injury, initial encounter EXAM: RIGHT ANKLE - COMPLETE 3+ VIEW COMPARISON:  None. FINDINGS: There is no evidence of fracture, dislocation, or joint effusion. There is no evidence of arthropathy or other focal bone abnormality. Soft tissues are unremarkable. IMPRESSION: No acute abnormality noted. Electronically Signed   By: Inez Catalina M.D.   On: 11/21/2017 12:16    Procedures Procedures (including critical care time)  Medications Ordered in ED Medications  ketorolac (TORADOL) 30 MG/ML injection 30 mg (has no administration in time range)     Initial Impression / Assessment and Plan / ED Course  I have reviewed the triage vital signs and the nursing notes.  Pertinent labs & imaging results that were available during my care of the patient were reviewed by me and considered in my medical decision making (see chart for details).     27 year old female presents to ED for evaluation of 3-day history of acute on chronic right ankle pain.  States that she has had similar pain in the past after she stands for long periods of time at work.  She has tried Tylenol with no improvement in her symptoms.  Denies any injury or trauma to the area.  Physical exam findings show tenderness palpation of the medial malleolus with no changes to range of motion, edema, erythema or warmth of joint noted.  Area is neurovascularly intact.  X-rays negative for acute abnormalities.  Suspect that her symptoms could be due to overuse.  Will give anti-inflammatories and encouraged her to complete range of motion exercises as tolerated to prevent stiffness.  Doubt infectious or vascular cause of symptoms. Advised to return to ED for any severe worsening symptoms.  Patient provided with work note.  Portions of this note were generated with [employer]. Dictation errors may occur despite best attempts at proofreading.   Final Clinical Impressions(s) / ED Diagnoses   Final diagnoses:  Chronic pain of right ankle  ED Discharge Orders         Ordered    naproxen (NAPROSYN) 500 MG tablet  2 times daily     11/21/17 1253           Delia Heady, PA-C 11/21/17 1254    Tegeler, Gwenyth Allegra, MD 11/21/17 1351

## 2018-12-05 ENCOUNTER — Other Ambulatory Visit: Payer: Self-pay

## 2018-12-05 DIAGNOSIS — Z20822 Contact with and (suspected) exposure to covid-19: Secondary | ICD-10-CM

## 2018-12-06 LAB — NOVEL CORONAVIRUS, NAA: SARS-CoV-2, NAA: NOT DETECTED

## 2018-12-11 ENCOUNTER — Telehealth: Payer: Self-pay

## 2018-12-11 NOTE — Telephone Encounter (Signed)
Phone call to pt. After was advised by an Agent that pt's. Employer requires documentation for her recent COVID test, prior to her returning to work.  Advised pt. A letter was sent via MyChart, indicating her test date and result date, and gives recommendations by CDC, for self isolating if results are either positive, or results are negative, but pt. Has symptoms.  While on phone, pt. Was able to access MyChart, and could see the letter.  Pt. Verb. That this is exactly what she needs to return to work.  No further action needed at this time.

## 2019-06-09 ENCOUNTER — Ambulatory Visit: Payer: Medicaid Other | Attending: Internal Medicine

## 2019-06-09 DIAGNOSIS — Z20822 Contact with and (suspected) exposure to covid-19: Secondary | ICD-10-CM

## 2019-06-10 LAB — NOVEL CORONAVIRUS, NAA: SARS-CoV-2, NAA: NOT DETECTED

## 2019-06-11 ENCOUNTER — Ambulatory Visit: Payer: Self-pay | Admitting: *Deleted

## 2019-06-11 NOTE — Telephone Encounter (Signed)
I returned her call.   She needs a note for her employer that it's ok to return to work since she has quarantined for 14 days and her COVID-19 test was negative.    I asked if she had a primary doctor and she does so I suggested she give him/her a call and let them know she needs a letter to return to work.  She was agreeable to this and thanked me for my help.

## 2019-06-12 ENCOUNTER — Telehealth: Payer: Self-pay | Admitting: *Deleted

## 2019-06-12 ENCOUNTER — Encounter: Payer: Self-pay | Admitting: *Deleted

## 2019-06-12 NOTE — Telephone Encounter (Signed)
Copied from [place] [id number]. Topic: General - Other >> Jun 12, 2019 10:35 AM Leward Quan A wrote: Reason for CRM:  Pt called to say that she need a letter stating she was tested and had to quarantine until she received her results. Pts needs this to return to work and asked if this can be put in her mychart/ please advise Ph# [phone]

## 2019-06-12 NOTE — Telephone Encounter (Signed)
Covid Testing Letter sent via MyChart.

## 2019-10-31 ENCOUNTER — Emergency Department (HOSPITAL_BASED_OUTPATIENT_CLINIC_OR_DEPARTMENT_OTHER): Payer: Medicaid Other

## 2019-10-31 ENCOUNTER — Encounter (HOSPITAL_BASED_OUTPATIENT_CLINIC_OR_DEPARTMENT_OTHER): Payer: Self-pay | Admitting: Emergency Medicine

## 2019-10-31 ENCOUNTER — Other Ambulatory Visit: Payer: Self-pay

## 2019-10-31 ENCOUNTER — Emergency Department (HOSPITAL_BASED_OUTPATIENT_CLINIC_OR_DEPARTMENT_OTHER)
Admission: EM | Admit: 2019-10-31 | Discharge: 2019-10-31 | Disposition: A | Discharge status: Home / Self Care | Payer: Medicaid Other | Attending: Emergency Medicine | Admitting: Emergency Medicine

## 2019-10-31 DIAGNOSIS — F17210 Nicotine dependence, cigarettes, uncomplicated: Secondary | ICD-10-CM | POA: Diagnosis not present

## 2019-10-31 DIAGNOSIS — F1210 Cannabis abuse, uncomplicated: Secondary | ICD-10-CM | POA: Insufficient documentation

## 2019-10-31 DIAGNOSIS — M25561 Pain in right knee: Secondary | ICD-10-CM | POA: Diagnosis present

## 2019-10-31 DIAGNOSIS — M79651 Pain in right thigh: Secondary | ICD-10-CM | POA: Diagnosis not present

## 2019-10-31 DIAGNOSIS — M79661 Pain in right lower leg: Secondary | ICD-10-CM | POA: Insufficient documentation

## 2019-10-31 MED ORDER — NAPROXEN 500 MG PO TABS: 500.0000 mg | ORAL_TABLET | Freq: Two times a day (BID) | ORAL | 0 refills | Status: AC

## 2019-10-31 MED ORDER — HYDROCODONE-ACETAMINOPHEN 5-325 MG PO TABS
1.0000 | ORAL_TABLET | Freq: Once | ORAL | Status: AC
  Administered 2019-10-31: 15:00:00 1 via ORAL
  Filled 2019-10-31: qty 1

## 2019-10-31 NOTE — ED Provider Notes (Signed)
[facility] EMERGENCY DEPARTMENT Provider Note   CSN: [id number] Arrival date & time: 10/31/19  1132     History Chief Complaint  Patient presents with  . Knee Pain    Sharon Pearson is a 29 y.o. female with no pertinent past medical history who presents today for evaluation of pain in her right knee.  She reports that since Monday she has had pain around her right knee on the medial aspect on the right calf.  She denies any fevers.  She states that she walks a lot at work.  She thinks she may have twisted it however is unsure.  She states that when she walks on it she feels a jolt of pain down to her foot and up into her hip.  She denies any pain in her back.  No weakness or numbness.  She denies any prior history of blood clot.  She denies any recent surgeries or immobilization.  She has tried Tylenol without significant relief.  She reports that naproxen helps her pain.  She has not taken any today.  HPI     Past Medical History:  Diagnosis Date  . Chlamydia   . Gonorrhea   . Infection    UTI  . STD (female)   . Trichomonas infection     There are no problems to display for this patient.   Past Surgical History:  Procedure Laterality Date  . INDUCED ABORTION    . NO PAST SURGERIES       OB History    Gravida  4   Para      Term      Preterm      AB  3   Living        SAB  1   TAB  1   Ectopic      Multiple      Live Births              Family History  Problem Relation Age of Onset  . Cancer Mother        breast and lung  . Cancer Maternal Grandmother        throat cancer    Social History   Tobacco Use  . Smoking status: Current Every Day Smoker    Packs/day: 1.00    Years: 7.00    Pack years: 7.00    Types: Cigarettes  . Smokeless tobacco: Never Used  Substance Use Topics  . Alcohol use: Yes    Comment: occ, none since + preg  . Drug use: Yes    Types: Marijuana    Comment: denies    Home Medications Prior to  Admission medications   Medication Sig Start Date End Date Taking? Authorizing Provider  acetaminophen (TYLENOL) 500 MG tablet Take 1 tablet (500 mg total) by mouth every 6 (six) hours as needed. 07/12/17   Law, Bea Graff, PA-C  cephALEXin (KEFLEX) 500 MG capsule Take 1 capsule (500 mg total) by mouth 3 (three) times daily. 02/08/16   Julianne Rice, MD  ibuprofen (ADVIL,MOTRIN) 600 MG tablet Take 1 tablet (600 mg total) by mouth every 6 (six) hours as needed. 07/12/17   Law, Bea Graff, PA-C  metroNIDAZOLE (FLAGYL) 500 MG tablet Take 1 tablet (500 mg total) by mouth 2 (two) times daily. 04/07/16   Joy, Shawn C, PA-C  naproxen (NAPROSYN) 500 MG tablet Take 1 tablet (500 mg total) by mouth 2 (two) times daily. 10/31/19   Lorin Glass,  PA-C    Allergies    Patient has no known allergies.  Review of Systems   Review of Systems  Constitutional: Negative for chills and fever.  Gastrointestinal: Negative for abdominal pain.  Musculoskeletal: Negative for back pain.       Pain in right knee, calf, and thigh  Skin: Negative for color change, rash and wound.  All other systems reviewed and are negative.   Physical Exam Updated Vital Signs BP (!) 133/91 (BP Location: Right Arm)   Pulse 78   Temp 98.6 F (37 C) (Oral)   Resp 16   Ht 5\' 3"  (1.6 m)   Wt 72.6 kg   LMP 10/12/2019   SpO2 100%   BMI 28.34 kg/m   Physical Exam Vitals and nursing note reviewed.  Constitutional:      General: She is not in acute distress.    Appearance: She is not ill-appearing.  HENT:     Head: Normocephalic.  Cardiovascular:     Rate and Rhythm: Normal rate.     Pulses: Normal pulses.  Pulmonary:     Effort: Pulmonary effort is normal. No respiratory distress.  Musculoskeletal:     Comments: Patient holds knee partially flexed.  Will not attempt to flex or extend as it "hurts too much". There is TTP primarily along the posterior aspect of the right proximal calf and thigh.  There is TTP  diffusely along the medial aspect of the knee however this is not as severe as the posterior calf.   Skin:    Comments: No abnormal erythema, rash, ecchymosis or skin breaks over right knee.   Neurological:     General: No focal deficit present.     Mental Status: She is alert.     Sensory: No sensory deficit.  Psychiatric:        Mood and Affect: Mood normal.     ED Results / Procedures / Treatments   Labs (all labs ordered are listed, but only abnormal results are displayed) Labs Reviewed - No data to display  EKG None  Radiology US Venous Img Lower Right (DVT Study)  Result Date: 10/31/2019 CLINICAL DATA:  29 year old female with a history knee injury EXAM: RIGHT LOWER EXTREMITY VENOUS DOPPLER ULTRASOUND TECHNIQUE: Gray-scale sonography with graded compression, as well as color Doppler and duplex ultrasound were performed to evaluate the lower extremity deep venous systems from the level of the common femoral vein and including the common femoral, femoral, profunda femoral, popliteal and calf veins including the posterior tibial, peroneal and gastrocnemius veins when visible. The superficial great saphenous vein was also interrogated. Spectral Doppler was utilized to evaluate flow at rest and with distal augmentation maneuvers in the common femoral, femoral and popliteal veins. COMPARISON:  None. FINDINGS: Contralateral Common Femoral Vein: Respiratory phasicity is normal and symmetric with the symptomatic side. No evidence of thrombus. Normal compressibility. Common Femoral Vein: No evidence of thrombus. Normal compressibility, respiratory phasicity and response to augmentation. Saphenofemoral Junction: No evidence of thrombus. Normal compressibility and flow on color Doppler imaging. Profunda Femoral Vein: No evidence of thrombus. Normal compressibility and flow on color Doppler imaging. Femoral Vein: No evidence of thrombus. Normal compressibility, respiratory phasicity and response to  augmentation. Popliteal Vein: No evidence of thrombus. Normal compressibility, respiratory phasicity and response to augmentation. Calf Veins: No evidence of thrombus. Normal compressibility and flow on color Doppler imaging. Superficial Great Saphenous Vein: No evidence of thrombus. Normal compressibility and flow on color Doppler imaging. Other Findings:  None.  IMPRESSION: Sonographic survey of the right lower extremity negative for DVT Electronically Signed   By: Corrie Mckusick D.O.   On: 10/31/2019 14:31   DG Knee Complete 4 Views Right  Result Date: 10/31/2019 CLINICAL DATA:  Right knee pain after twisting injury. Unable to bear weight. EXAM: RIGHT KNEE - COMPLETE 4+ VIEW COMPARISON:  None. FINDINGS: No evidence of fracture, or dislocation. Small suprapatellar joint effusion. No evidence of arthropathy or other focal bone abnormality. Soft tissues are unremarkable. IMPRESSION: 1. No acute fracture or dislocation identified about the right knee. 2. Small suprapatellar joint effusion. Electronically Signed   By: Fidela Salisbury M.D.   On: 10/31/2019 14:39    Procedures Procedures (including critical care time)  Medications Ordered in ED Medications  HYDROcodone-acetaminophen (NORCO/VICODIN) 5-325 MG per tablet 1 tablet (1 tablet Oral Given 10/31/19 1433)    ED Course  I have reviewed the triage vital signs and the nursing notes.  Pertinent labs & imaging results that were available during my care of the patient were reviewed by me and considered in my medical decision making (see chart for details).    MDM Rules/Calculators/A&P                         Sharon Pearson is a 29 year old woman who presents today for evaluation of pain in her right knee and leg.  Her pain has been worsening over the past 5 days to a week.  She denies any clear specific injury however thinks she may have possibly twisted her knee.  On my exam she has tenderness to palpation primarily in the right calf.  She will  not attempt to range her knee due to pain.  Unable to apply Wells DVT criteria based on reports tenderness around the deep venous system.  Plan to obtain knee x-ray and DVT study.   X-ray and DVT study are reassuring.    She was offered crutches and a brace which she declined.  She is requesting a prescription for naproxen which is given.  Recommended follow-up with orthopedics if her symptoms fail to improve.  Return precautions were discussed with patient who states their understanding.  At the time of discharge patient denied any unaddressed complaints or concerns.  Patient is agreeable for discharge home.  Note: Portions of this report may have been transcribed using voice recognition software. Every effort was made to ensure accuracy; however, inadvertent computerized transcription errors may be present   Final Clinical Impression(s) / ED Diagnoses Final diagnoses:  Acute pain of right knee    Rx / DC Orders ED Discharge Orders         Ordered    naproxen (NAPROSYN) 500 MG tablet  2 times daily     Discontinue  Reprint     10/31/19 1535           Lorin Glass, [place] 10/31/19 1835    Wyvonnia Dusky, MD 11/01/19 [phone]

## 2019-10-31 NOTE — ED Notes (Signed)
Pt in radiology 

## 2019-10-31 NOTE — ED Notes (Signed)
Right knee pain, onset this past Monday, states she works at [employer] and walks a lot, Denies any injury or trauma. Pain radiates to rt ankle.

## 2019-10-31 NOTE — Discharge Instructions (Addendum)
Today you received medications that may make you sleepy or impair your ability to make decisions.  For the next 24 hours please do not drive, operate heavy machinery, care for a small child with out another adult present, or perform any activities that may cause harm to you or someone else if you were to fall asleep or be impaired.   If you may be pregnant than you need to take a test before taking the naproxen.  Do not take naproxen while pregnant.  Please take your naproxen and Tylenol (acetaminophen) to relieve your pain.   In between doses of naproxen you make take tylenol, up to 1,000 mg (two extra strength pills).  Do not take more than 3,000 mg tylenol in a 24 hour period.  Please check all medication labels as many medications such as pain and cold medications may contain tylenol.  Do not drink alcohol while taking these medications.  Do not take other NSAID'S while taking naproxen (such as aleve or ibuprofen, motrin or advil).  Please take naproxen with food to decrease stomach upset.

## 2019-10-31 NOTE — ED Notes (Signed)
Has strong plantar and dorsal flexion of RLE, rt knees notes some swelling

## 2019-10-31 NOTE — ED Triage Notes (Signed)
R knee pain since Monday. States she walks a lot at work. Minimal relief with naproxen.

## 2019-11-12 ENCOUNTER — Telehealth: Payer: Self-pay | Admitting: Family Medicine

## 2019-11-12 NOTE — Telephone Encounter (Signed)
ED referral for Rt knee pain-- called pt to set appt -- unable to lv message voicemail box full& no answer @ listed ph#  --glh

## 2021-11-07 IMAGING — DX DG KNEE COMPLETE 4+V*R*
4 series · 4 of 4 positions shown · non-contrast
Comparison: None.

CLINICAL DATA: Right knee pain after twisting injury.

Unable to bear weight.
EXAM:
RIGHT KNEE - COMPLETE 4+ VIEW

[knee ap]
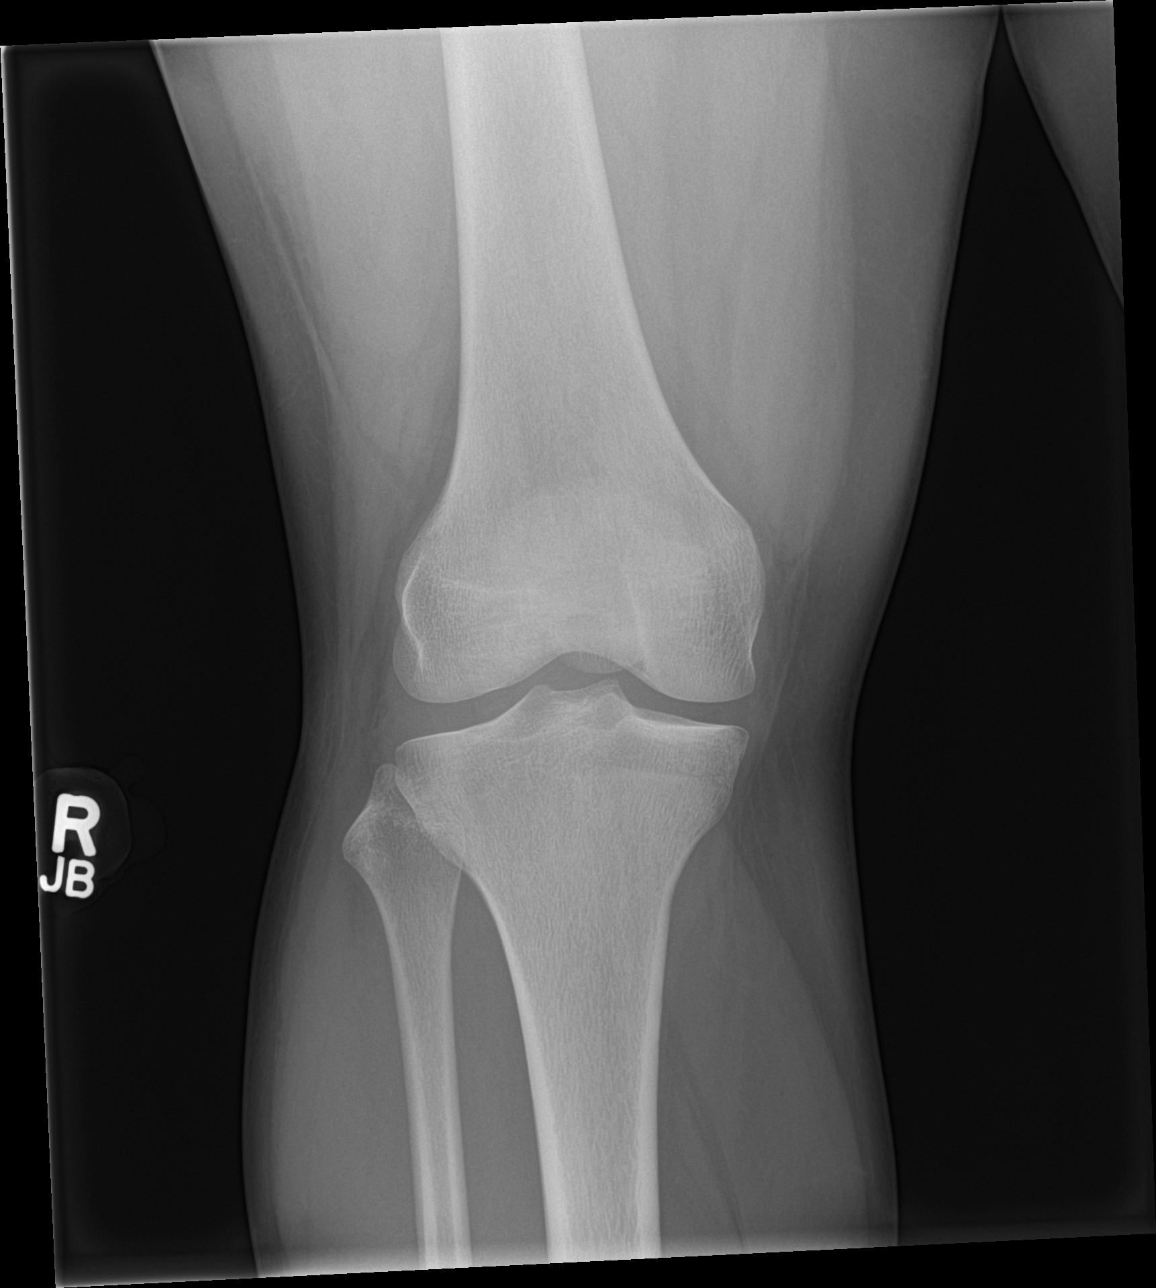

[knee lat]
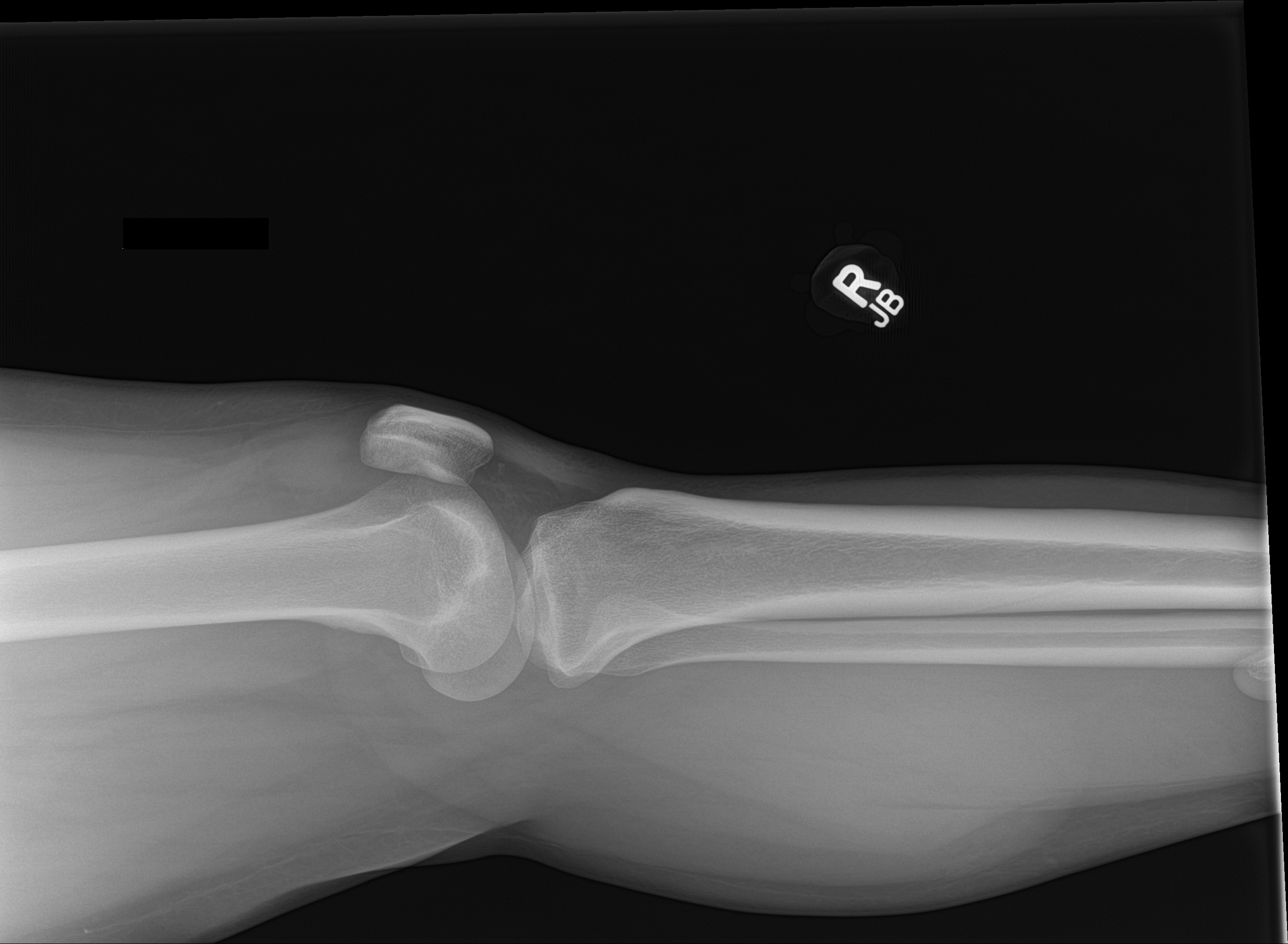

[knee obl (1 of 2)]
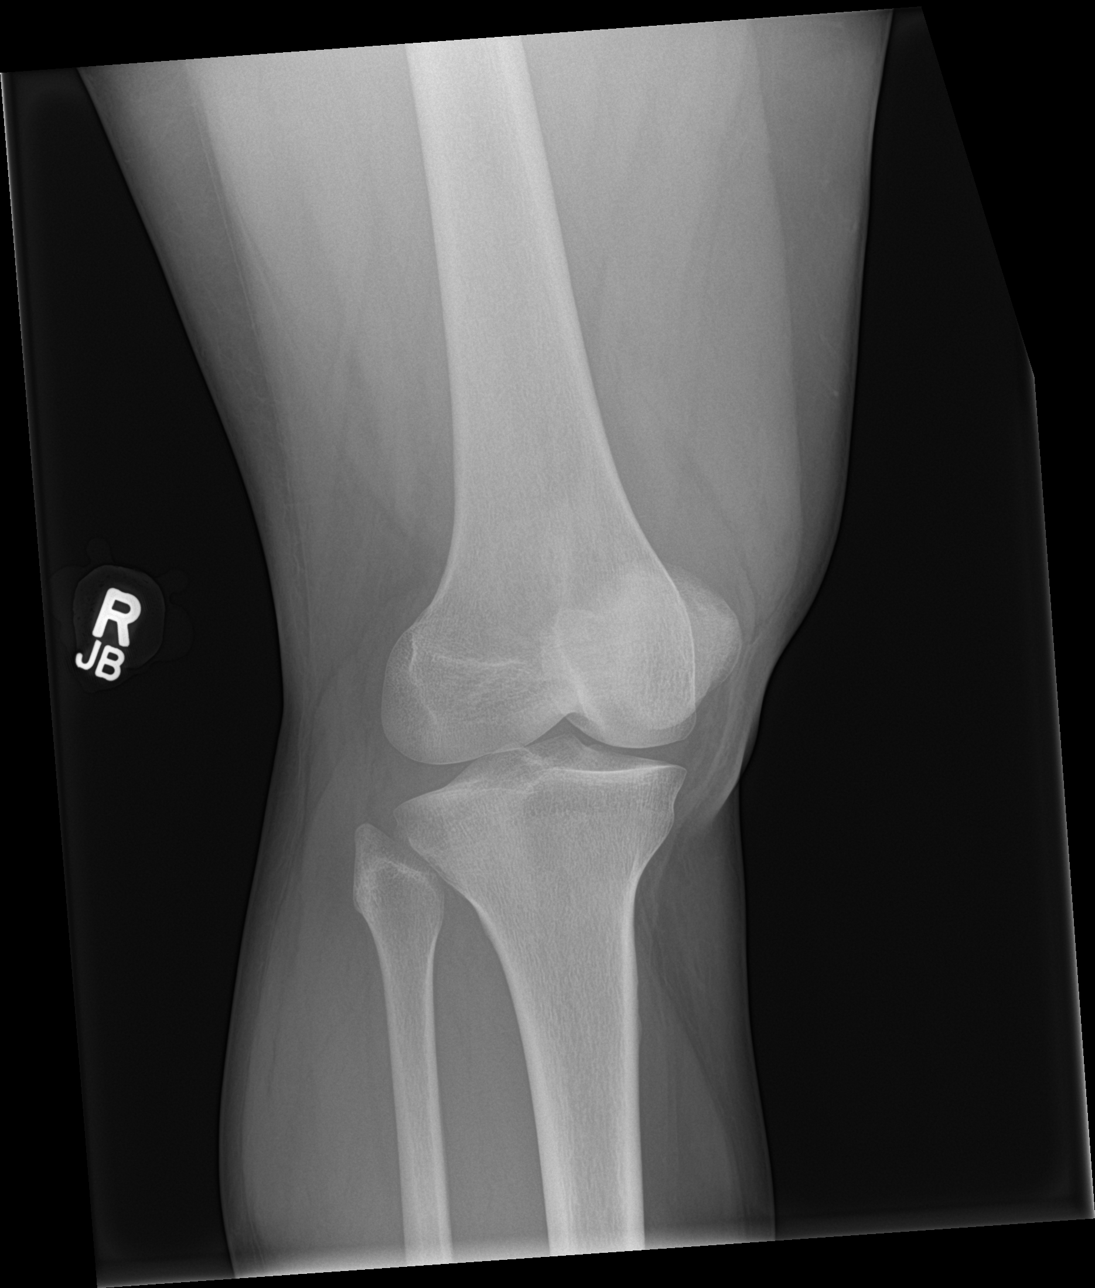

[knee obl (2 of 2)]
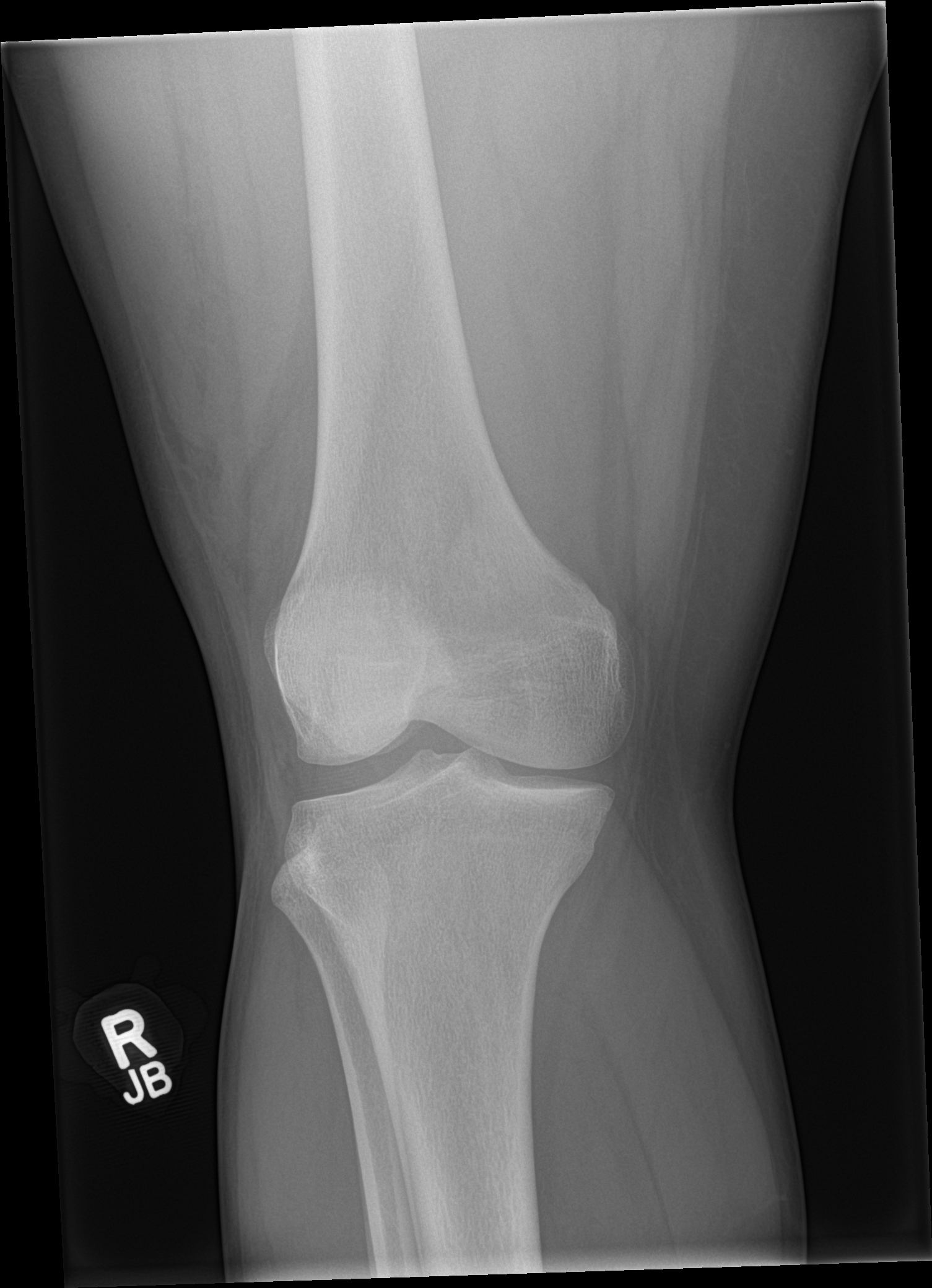

[4 of 4 positions shown; findings below may reference images not displayed]

FINDINGS: No evidence of fracture, or dislocation. Small suprapatellar joint
effusion. No evidence of arthropathy or other focal bone
abnormality. Soft tissues are unremarkable.
IMPRESSION: 1. No acute fracture or dislocation identified about the right knee.
2. Small suprapatellar joint effusion.

## 2022-03-23 ENCOUNTER — Emergency Department (HOSPITAL_BASED_OUTPATIENT_CLINIC_OR_DEPARTMENT_OTHER): Payer: Medicaid Other

## 2022-03-23 ENCOUNTER — Other Ambulatory Visit: Payer: Self-pay

## 2022-03-23 ENCOUNTER — Encounter (HOSPITAL_BASED_OUTPATIENT_CLINIC_OR_DEPARTMENT_OTHER): Payer: Self-pay | Admitting: Urology

## 2022-03-23 ENCOUNTER — Emergency Department (HOSPITAL_BASED_OUTPATIENT_CLINIC_OR_DEPARTMENT_OTHER)
Admission: EM | Admit: 2022-03-23 | Discharge: 2022-03-23 | Disposition: A | Discharge status: Home / Self Care | Payer: Medicaid Other | Attending: Emergency Medicine | Admitting: Emergency Medicine

## 2022-03-23 DIAGNOSIS — R070 Pain in throat: Secondary | ICD-10-CM | POA: Insufficient documentation

## 2022-03-23 MED ORDER — ALUM & MAG HYDROXIDE-SIMETH 200-200-20 MG/5ML PO SUSP
30.0000 mL | Freq: Once | ORAL | Status: AC
  Administered 2022-03-23: 30 mL via ORAL
  Filled 2022-03-23: qty 30

## 2022-03-23 NOTE — ED Triage Notes (Signed)
Pt states feels like dorito is stuck in throat since last night  Able to swallow with no concern but feels like it is poking her  Pain worse with swallowing   NAD or obstruction

## 2022-03-23 NOTE — Discharge Instructions (Addendum)
Please take tylenol/ibuprofen for pain. I recommend close follow-up with gastroenterology for reevaluation.  Please do not hesitate to return to emergency department if worrisome signs symptoms we discussed become apparent.

## 2022-03-23 NOTE — ED Provider Notes (Signed)
[facility] EMERGENC[facility] DEPARTMENT Provider Note   CSN: [id number] Arrival date & time: 03/23/22  1250     History  Chief Complaint  Patient presents with   Swallowed Foreign Body    Sharon Pearson is a 31 y.o. female.  With a past medical history of STD, UTI presenting to the emergency department for evaluation of throat pain.  Patient states she felt like a piece of TauriDose stuck in the throat last night.  Since then the patient has felt shortness of breath and she has been afraid to swallow food or drink. States "I can't breathe". Denies nausea or vomiting.    Swallowed Foreign Body      Past Medical History:  Diagnosis Date   Chlamydia    Gonorrhea    Infection    UTI   STD (female)    Trichomonas infection    Past Surgical History:  Procedure Laterality Date   INDUCED ABORTION     NO PAST SURGERIES       Home Medications Prior to Admission medications   Medication Sig Start Date End Date Taking? Authorizing Provider  acetaminophen (TYLENOL) 500 MG tablet Take 1 tablet (500 mg total) by mouth every 6 (six) hours as needed. 07/12/17   Law, Bea Graff, PA-C  cephALEXin (KEFLEX) 500 MG capsule Take 1 capsule (500 mg total) by mouth 3 (three) times daily. 02/08/16   Julianne Rice, MD  ibuprofen (ADVIL,MOTRIN) 600 MG tablet Take 1 tablet (600 mg total) by mouth every 6 (six) hours as needed. 07/12/17   Law, Bea Graff, PA-C  metroNIDAZOLE (FLAGYL) 500 MG tablet Take 1 tablet (500 mg total) by mouth 2 (two) times daily. 04/07/16   Joy, Shawn C, PA-C  naproxen (NAPROSYN) 500 MG tablet Take 1 tablet (500 mg total) by mouth 2 (two) times daily. 10/31/19   Lorin Glass, PA-C      Allergies    Patient has no known allergies.    Review of Systems   Review of Systems Negative except as per HPI.  Physical Exam Updated Vital Signs BP 105/77   Pulse 64   Temp 98.1 F (36.7 C)   Resp (!) 24   Ht '5\' 3"'$  (1.6 m)   Wt 72.6 kg   LMP 02/28/2022  (Approximate)   SpO2 99%   BMI 28.35 kg/m  Physical Exam Vitals and nursing note reviewed.  Constitutional:      Appearance: Normal appearance.  HENT:     Head: Normocephalic and atraumatic.     Mouth/Throat:     Mouth: Mucous membranes are moist.  Eyes:     General: No scleral icterus. Cardiovascular:     Rate and Rhythm: Normal rate and regular rhythm.     Pulses: Normal pulses.     Heart sounds: Normal heart sounds.  Pulmonary:     Effort: Pulmonary effort is normal.     Breath sounds: Normal breath sounds.  Abdominal:     General: Abdomen is flat.     Palpations: Abdomen is soft.     Tenderness: There is no abdominal tenderness.  Musculoskeletal:        General: No deformity.  Skin:    General: Skin is warm.     Findings: No rash.  Neurological:     General: No focal deficit present.     Mental Status: She is alert.  Psychiatric:        Mood and Affect: Mood normal.     ED Results /  Procedures / Treatments   Labs (all labs ordered are listed, but only abnormal results are displayed) Labs Reviewed - No data to display  EKG None  Radiology CT Soft Tissue Neck Wo Contrast  Result Date: 03/23/2022 CLINICAL DATA:  Foreign body sensation. Dorito chip stuck in throat since last night. EXAM: CT NECK WITHOUT CONTRAST TECHNIQUE: Multidetector CT imaging of the neck was performed following the standard protocol without intravenous contrast. RADIATION DOSE REDUCTION: This exam was performed according to the departmental dose-optimization program which includes automated exposure control, adjustment of the mA and/or kV according to patient size and/or use of iterative reconstruction technique. COMPARISON:  None Available. FINDINGS: Pharynx and larynx: No mucosal or submucosal lesion. Some vallecular asymmetry but this is probably normal. No sign of foreign object. Salivary glands: Parotid and submandibular glands are normal. Thyroid: Normal Lymph nodes: No lymphadenopathy.  Vascular: Normal Limited intracranial: Normal Visualized orbits: Normal Mastoids and visualized paranasal sinuses: Clear Skeleton: Normal Upper chest: Normal Other: None IMPRESSION: Negative CT scan of the neck. No sign of foreign object. Electronically Signed   By: Nelson Chimes M.D.   On: 03/23/2022 15:15    Procedures Procedures    Medications Ordered in ED Medications  alum & mag hydroxide-simeth (MAALOX/MYLANTA) 200-200-20 MG/5ML suspension 30 mL (30 mLs Oral Given 03/23/22 1449)    ED Course/ Medical Decision Making/ A&P                           Medical Decision Making Amount and/or Complexity of Data Reviewed Radiology: ordered.  Risk OTC drugs.   This patient presents to the ED for foreign body sensation in throat, this involves an extensive number of treatment options, and is a complaint that carries with a high risk of complications and morbidity.  The differential diagnosis includes foreign body, esophageal abrasion, perforation, esophagitis.  This is not an exhaustive list.  Comorbidities that complicate the patient evaluation See HPI  Social determinants of health NA  Additional history obtained: External records from outside source obtained and reviewed including: Chart review including previous notes, labs, imaging.  Cardiac monitoring/EKG: The patient was maintained on a cardiac monitor.  I personally reviewed and interpreted the cardiac monitor which showed an underlying rhythm of: Sinus rhythm.  Lab tests:  Imaging studies: I ordered imaging studies including: CT soft tissue of the neck showed no evidence of foreign body.  I personally reviewed, interpreted imaging and agree with the radiologist's interpretations.  Problem list/ ED course/ Critical interventions/ Medical management: HPI: See above Vital signs within normal range and stable throughout visit. Laboratory/imaging studies significant for: See above. On physical examination, patient is  afebrile and appears in no acute distress. Patient is able to tolerate PO. No nausea or vomiting. There crepitus heard on neck on auscultation. No tenderness to palpation to the neck CT soft tissue of the neck showed no evidence of foreign body.  Based on patient's clinical presentations and laboratory/imaging studies I suspect esophageal abrasion. Esophageal perforation is unlikely as that was not shown on CT scan. GI cocktail ordered. Reevaluation of the patient after these medications showed that the patient stayed the same. I think she might benefit from having an endoscope with GI if symptoms still persist. Advised patient to follow up with gastroenterology for further evalution and management. Take tylenol/ibuprofen for pain. Return to the ER if new of worsening symptoms. I have reviewed the patient home medicines and have made adjustments as needed.  Consultations obtained: I requested consultation with Dr. Ashok Cordia, and discussed lab and imaging findings as well as pertinent plan.  He/she agrees with the plan.  Disposition Continued outpatient therapy. Follow-up with gastroenterology recommended for reevaluation of symptoms. Treatment plan discussed with patient.  Pt acknowledged understanding was agreeable to the plan. Worrisome signs and symptoms were discussed with patient, and patient acknowledged understanding to return to the ED if they noticed these signs and symptoms. Patient was stable upon discharge.   This chart was dictated using voice recognition software.  Despite best efforts to proofread,  errors can occur which can change the documentation meaning.          Final Clinical Impression(s) / ED Diagnoses Final diagnoses:  Throat pain in adult    Rx / DC Orders ED Discharge Orders     None         Rex Kras, [place] 03/23/22 1807    Lajean Saver, MD 03/24/22 2330

## 2022-03-24 ENCOUNTER — Emergency Department (HOSPITAL_BASED_OUTPATIENT_CLINIC_OR_DEPARTMENT_OTHER)
Admission: EM | Admit: 2022-03-24 | Discharge: 2022-03-24 | Disposition: A | Discharge status: Home / Self Care | Payer: Medicaid Other | Attending: Emergency Medicine | Admitting: Emergency Medicine

## 2022-03-24 ENCOUNTER — Encounter (HOSPITAL_BASED_OUTPATIENT_CLINIC_OR_DEPARTMENT_OTHER): Payer: Self-pay | Admitting: Emergency Medicine

## 2022-03-24 ENCOUNTER — Emergency Department (HOSPITAL_BASED_OUTPATIENT_CLINIC_OR_DEPARTMENT_OTHER): Payer: Medicaid Other

## 2022-03-24 DIAGNOSIS — R09A2 Foreign body sensation, throat: Secondary | ICD-10-CM | POA: Insufficient documentation

## 2022-03-24 MED ORDER — LIDOCAINE VISCOUS HCL 2 % MT SOLN: 15.0000 mL | OROMUCOSAL | 0 refills | Status: AC | PRN

## 2022-03-24 MED ORDER — LIDOCAINE VISCOUS HCL 2 % MT SOLN: 15.0000 mL | Freq: Once | OROMUCOSAL | Status: DC

## 2022-03-24 MED ORDER — LIDOCAINE VISCOUS HCL 2 % MT SOLN
15.0000 mL | Freq: Once | OROMUCOSAL | Status: AC
  Administered 2022-03-24: 15 mL via OROMUCOSAL
  Filled 2022-03-24: qty 15

## 2022-03-24 NOTE — ED Provider Notes (Signed)
[facility] EMER[facility]ENC[facility] DEPARTMENT Provider Note   CSN: [id number] Arrival date & time: 03/24/22  0849     History  Chief Complaint  Patient presents with   Swallowed Foreign Body   HPI Sharon Pearson is a 31 y.o. female presenting for swallowed foreign body.  Patient states that she swallowed a Doritos on Thursday and is certain that the chip is stuck in her throat.  She states she can feel it in the left side of her throat.  She has been able to drink liquids but is afraid to eat solid food.  She does state that she has trouble breathing at night while sleeping.  States that she was evaluated yesterday for this complaint and was diagnosed with esophageal abrasion but patient is convinced that the diarrhea is still stuck in her throat.  States that she did see some bright red specks of blood in her mouth this morning.   Swallowed Foreign Body       Home Medications Prior to Admission medications   Medication Sig Start Date End Date Taking? Authorizing Provider  acetaminophen (TYLENOL) 500 MG tablet Take 1 tablet (500 mg total) by mouth every 6 (six) hours as needed. 07/12/17   Law, Bea Graff, PA-C  cephALEXin (KEFLEX) 500 MG capsule Take 1 capsule (500 mg total) by mouth 3 (three) times daily. 02/08/16   Julianne Rice, MD  ibuprofen (ADVIL,MOTRIN) 600 MG tablet Take 1 tablet (600 mg total) by mouth every 6 (six) hours as needed. 07/12/17   Law, Bea Graff, PA-C  lidocaine (XYLOCAINE) 2 % solution Use as directed 15 mLs in the mouth or throat as needed for mouth pain. 03/24/22  Yes Harriet Pho, PA-C  metroNIDAZOLE (FLAGYL) 500 MG tablet Take 1 tablet (500 mg total) by mouth 2 (two) times daily. 04/07/16   Joy, Shawn C, PA-C  naproxen (NAPROSYN) 500 MG tablet Take 1 tablet (500 mg total) by mouth 2 (two) times daily. 10/31/19   Lorin Glass, PA-C      Allergies    Patient has no known allergies.    Review of Systems   Review of Systems  Physical  Exam Updated Vital Signs BP 136/65 (BP Location: Right Arm)   Pulse 92   Temp 98.2 F (36.8 C) (Oral)   Resp 17   Ht '5\' 3"'$  (1.6 m)   Wt 72.6 kg   LMP 02/28/2022 (Approximate)   SpO2 100%   BMI 28.34 kg/m  Physical Exam Vitals and nursing note reviewed.  HENT:     Head: Normocephalic and atraumatic.     Mouth/Throat:     Mouth: Mucous membranes are moist.  Eyes:     General:        Right eye: No discharge.        Left eye: No discharge.     Conjunctiva/sclera: Conjunctivae normal.  Cardiovascular:     Rate and Rhythm: Normal rate and regular rhythm.     Pulses: Normal pulses.     Heart sounds: Normal heart sounds.  Pulmonary:     Effort: Pulmonary effort is normal.     Breath sounds: Normal breath sounds.  Abdominal:     General: Abdomen is flat.     Palpations: Abdomen is soft.  Skin:    General: Skin is warm and dry.  Neurological:     General: No focal deficit present.  Psychiatric:        Mood and Affect: Mood normal.  ED Results / Procedures / Treatments   Labs (all labs ordered are listed, but only abnormal results are displayed) Labs Reviewed - No data to display  EKG None  Radiology DG Chest 2 View  Result Date: 03/24/2022 CLINICAL DATA:  Pt states she has dorito stuck in her throat since Thursday. Has not eaten since. Feels it in left side of throat. EXAM: CHEST - 2 VIEW COMPARISON:  None Available. FINDINGS: Normal heart, mediastinum and hila. Clear lungs.  No pleural effusion or pneumothorax. Skeletal structures are within normal limits. No radiopaque foreign body. IMPRESSION: Normal chest radiographs. Electronically Signed   By: Lajean Manes M.D.   On: 03/24/2022 13:26   DG Neck Soft Tissue  Result Date: 03/24/2022 CLINICAL DATA:  Foreign body sensation in the throat for 3 days. EXAM: NECK SOFT TISSUES - 1+ VIEW COMPARISON:  None Available. FINDINGS: Soft tissues are unremarkable with no evidence of a radiopaque foreign body. Visualized airway  is widely patent. Normal skeletal structures. IMPRESSION: Negative. Electronically Signed   By: Lajean Manes M.D.   On: 03/24/2022 13:22   CT Soft Tissue Neck Wo Contrast  Result Date: 03/23/2022 CLINICAL DATA:  Foreign body sensation. Dorito chip stuck in throat since last night. EXAM: CT NECK WITHOUT CONTRAST TECHNIQUE: Multidetector CT imaging of the neck was performed following the standard protocol without intravenous contrast. RADIATION DOSE REDUCTION: This exam was performed according to the departmental dose-optimization program which includes automated exposure control, adjustment of the mA and/or kV according to patient size and/or use of iterative reconstruction technique. COMPARISON:  None Available. FINDINGS: Pharynx and larynx: No mucosal or submucosal lesion. Some vallecular asymmetry but this is probably normal. No sign of foreign object. Salivary glands: Parotid and submandibular glands are normal. Thyroid: Normal Lymph nodes: No lymphadenopathy. Vascular: Normal Limited intracranial: Normal Visualized orbits: Normal Mastoids and visualized paranasal sinuses: Clear Skeleton: Normal Upper chest: Normal Other: None IMPRESSION: Negative CT scan of the neck. No sign of foreign object. Electronically Signed   By: Nelson Chimes M.D.   On: 03/23/2022 15:15    Procedures Procedures    Medications Ordered in ED Medications  lidocaine (XYLOCAINE) 2 % viscous mouth solution 15 mL (15 mLs Mouth/Throat Given 03/24/22 1228)    ED Course/ Medical Decision Making/ A&P                           Medical Decision Making Amount and/or Complexity of Data Reviewed Radiology: ordered.  Risk Prescription drug management.   31 year old female who is nontoxic and well-appearing presenting for swallowed foreign body sensation.  Per chart review she was evaluated for this complaint yesterday was diagnosed with esophageal abrasion.  Symptoms today are consistent with that diagnosis.  The foreign body  sensation she is experiencing is likely due to esophageal irritation and swelling.  X-rays of her neck were all negative.  To consider posterior pharyngeal abscess deep space abscess of the neck but unlikely given patient has no fever and inspection of her throat was otherwise normal and neck was nontender to palpation.  Treated her symptoms with viscous lidocaine.  Able to fluid challenge with no issue or complication.  Patient stated that her throat felt better and requested a refill so that she could use it at home.  Discussed return precautions.  Advised her to follow-up with her PCP if her symptoms persist.        Final Clinical Impression(s) / ED Diagnoses Final diagnoses:  Foreign body sensation, throat    Rx / DC Orders ED Discharge Orders          Ordered    lidocaine (XYLOCAINE) 2 % solution  As needed        03/24/22 1358              Lindell Spar 03/24/22 1439    Ezequiel Essex, MD 03/24/22 1816

## 2022-03-24 NOTE — ED Triage Notes (Signed)
Pt arrives pov, states it feels like dorito is stuck in throat, and is now vomiting red blood. Tx yesterday for obstruction. Pt breathing, no s/s of distress

## 2022-03-24 NOTE — ED Notes (Signed)
Per EDP order, pt given fluids and/or food for PO challenge. Pt verbalized understanding to utilize call bell if nausea or emesis occur. 

## 2022-03-24 NOTE — Discharge Instructions (Addendum)
Evaluation of your foreign body sensation of the throat was overall reassuring.  X-rays were unremarkable.  Recommend that you take viscous lidocaine to treat your symptoms.  Foreign body sensation is likely caused by esophageal irritation related to possible tear when you swallowed the treated initially.  Should get better with time.  If you have trouble swallowing, shortness of breath please return to emergency department for evaluation.  Otherwise recommend that you follow-up with your PCP for ongoing evaluation for foreign body sensation in throat.

## 2022-07-30 ENCOUNTER — Encounter: Payer: Self-pay | Admitting: *Deleted

## 2024-05-18 ENCOUNTER — Emergency Department (HOSPITAL_BASED_OUTPATIENT_CLINIC_OR_DEPARTMENT_OTHER)
Admission: EM | Admit: 2024-05-18 | Discharge: 2024-05-18 | Disposition: A | Discharge status: Home / Self Care | Payer: Self-pay

## 2024-05-18 ENCOUNTER — Other Ambulatory Visit: Payer: Self-pay

## 2024-05-18 ENCOUNTER — Encounter (HOSPITAL_BASED_OUTPATIENT_CLINIC_OR_DEPARTMENT_OTHER): Payer: Self-pay

## 2024-05-18 ENCOUNTER — Emergency Department (HOSPITAL_BASED_OUTPATIENT_CLINIC_OR_DEPARTMENT_OTHER): Payer: Self-pay

## 2024-05-18 DIAGNOSIS — O208 Other hemorrhage in early pregnancy: Secondary | ICD-10-CM

## 2024-05-18 DIAGNOSIS — Z3A01 Less than 8 weeks gestation of pregnancy: Secondary | ICD-10-CM | POA: Insufficient documentation

## 2024-05-18 DIAGNOSIS — N390 Urinary tract infection, site not specified: Secondary | ICD-10-CM

## 2024-05-18 DIAGNOSIS — Z720 Tobacco use: Secondary | ICD-10-CM | POA: Insufficient documentation

## 2024-05-18 DIAGNOSIS — O2391 Unspecified genitourinary tract infection in pregnancy, first trimester: Secondary | ICD-10-CM | POA: Insufficient documentation

## 2024-05-18 DIAGNOSIS — O4691 Antepartum hemorrhage, unspecified, first trimester: Secondary | ICD-10-CM | POA: Insufficient documentation

## 2024-05-18 LAB — COMPREHENSIVE METABOLIC PANEL WITH GFR
ALT: 14 U/L (ref 0–44)
AST: 18 U/L (ref 15–41)
Albumin: 4.5 g/dL (ref 3.5–5.0)
Alkaline Phosphatase: 61 U/L (ref 38–126)
Anion gap: 12 (ref 5–15)
BUN: 5 mg/dL — ABNORMAL LOW (ref 6–20)
CO2: 20 mmol/L — ABNORMAL LOW (ref 22–32)
Calcium: 9.2 mg/dL (ref 8.9–10.3)
Chloride: 105 mmol/L (ref 98–111)
Creatinine, Ser: 0.64 mg/dL (ref 0.44–1.00)
GFR, Estimated: >60 mL/min
Glucose, Bld: 99 mg/dL (ref 70–99)
Potassium: 4.3 mmol/L (ref 3.5–5.1)
Sodium: 137 mmol/L (ref 135–145)
Total Bilirubin: 0.3 mg/dL (ref 0.0–1.2)
Total Protein: 6.5 g/dL (ref 6.5–8.1)

## 2024-05-18 LAB — ABO/RH: ABO/RH(D): B POS

## 2024-05-18 LAB — CBC WITH DIFFERENTIAL/PLATELET
Abs Immature Granulocytes: 0.02 10*3/uL (ref 0.00–0.07)
Basophils Absolute: 0 10*3/uL (ref 0.0–0.1)
Basophils Relative: 0 %
Eosinophils Absolute: 0.1 10*3/uL (ref 0.0–0.5)
Eosinophils Relative: 2 %
HCT: 35.5 % — ABNORMAL LOW (ref 36.0–46.0)
Hemoglobin: 11.7 g/dL — ABNORMAL LOW (ref 12.0–15.0)
Immature Granulocytes: 0 %
Lymphocytes Relative: 21 %
Lymphs Abs: 1.5 10*3/uL (ref 0.7–4.0)
MCH: 26 pg (ref 26.0–34.0)
MCHC: 33 g/dL (ref 30.0–36.0)
MCV: 78.9 fL — ABNORMAL LOW (ref 80.0–100.0)
Monocytes Absolute: 0.6 10*3/uL (ref 0.1–1.0)
Monocytes Relative: 8 %
Neutro Abs: 4.8 10*3/uL (ref 1.7–7.7)
Neutrophils Relative %: 69 %
Platelets: 267 10*3/uL (ref 150–400)
RBC: 4.5 MIL/uL (ref 3.87–5.11)
RDW: 17 % — ABNORMAL HIGH (ref 11.5–15.5)
WBC: 7 10*3/uL (ref 4.0–10.5)
nRBC: 0 % (ref 0.0–0.2)

## 2024-05-18 LAB — URINALYSIS, MICROSCOPIC (REFLEX)

## 2024-05-18 LAB — URINALYSIS, ROUTINE W REFLEX MICROSCOPIC
Bilirubin Urine: NEGATIVE
Glucose, UA: NEGATIVE mg/dL
Ketones, ur: NEGATIVE mg/dL
Nitrite: NEGATIVE
Protein, ur: NEGATIVE mg/dL
Specific Gravity, Urine: <=1.005 (ref 1.005–1.030)
pH: 6.5 (ref 5.0–8.0)

## 2024-05-18 LAB — WET PREP, GENITAL
Clue Cells Wet Prep HPF POC: NONE SEEN
Sperm: NONE SEEN
Trich, Wet Prep: NONE SEEN
WBC, Wet Prep HPF POC: <10
Yeast Wet Prep HPF POC: NONE SEEN

## 2024-05-18 LAB — HCG, QUANTITATIVE, PREGNANCY: hCG, Beta Chain, Quant, S: 107579 m[IU]/mL — ABNORMAL HIGH

## 2024-05-18 LAB — LIPASE, BLOOD: Lipase: 47 U/L (ref 11–51)

## 2024-05-18 MED ORDER — FOSFOMYCIN TROMETHAMINE 3 G PO PACK
3.0000 g | PACK | Freq: Once | ORAL | Status: AC
  Administered 2024-05-18: 3 g via ORAL
  Filled 2024-05-18: qty 3

## 2024-05-18 NOTE — ED Notes (Signed)
Lab notified to add-on urine culture to previously collected urine sample.  

## 2024-05-18 NOTE — ED Notes (Signed)
 D/c paperwork reviewed with pt, including follow up care.  All questions and/or concerns addressed at time of d/c.  No further needs expressed. . Pt verbalized understanding, Ambulatory without assistance to ED exit, NAD.

## 2024-05-19 LAB — URINE CULTURE: Culture: NO GROWTH

## 2024-05-19 LAB — GC/CHLAMYDIA PROBE AMP (~~LOC~~) NOT AT ARMC
Chlamydia: NEGATIVE
Comment: NEGATIVE
Comment: NORMAL
Neisseria Gonorrhea: NEGATIVE
